# Patient Record
Sex: Female | Born: 1965 | Race: White | Hispanic: No | Smoking: Former smoker
Health system: Southern US, Community
[De-identification: ages and names within clinical notes are randomized; demographics above are authoritative.]

## PROBLEM LIST (undated history)

## (undated) DIAGNOSIS — I10 Essential (primary) hypertension: Secondary | ICD-10-CM

## (undated) DIAGNOSIS — E7800 Pure hypercholesterolemia, unspecified: Secondary | ICD-10-CM

## (undated) DIAGNOSIS — R569 Unspecified convulsions: Secondary | ICD-10-CM

## (undated) HISTORY — PX: TUBAL LIGATION: SHX77

---

## 2005-02-18 ENCOUNTER — Ambulatory Visit: Payer: Self-pay | Admitting: Family Medicine

## 2005-02-26 ENCOUNTER — Ambulatory Visit: Payer: Self-pay | Admitting: Family Medicine

## 2006-07-24 ENCOUNTER — Ambulatory Visit: Payer: Self-pay | Admitting: Family Medicine

## 2008-01-26 ENCOUNTER — Emergency Department: Payer: Self-pay | Admitting: Emergency Medicine

## 2013-08-17 ENCOUNTER — Ambulatory Visit: Payer: Self-pay | Admitting: Family Medicine

## 2016-03-18 ENCOUNTER — Other Ambulatory Visit: Payer: Self-pay | Admitting: Family Medicine

## 2016-03-18 DIAGNOSIS — Z1231 Encounter for screening mammogram for malignant neoplasm of breast: Secondary | ICD-10-CM

## 2016-03-27 ENCOUNTER — Ambulatory Visit
Admission: RE | Admit: 2016-03-27 | Discharge: 2016-03-27 | Disposition: A | Discharge status: Home / Self Care | Payer: BC Managed Care – PPO | Source: Ambulatory Visit | Attending: Family Medicine | Admitting: Family Medicine

## 2016-03-27 DIAGNOSIS — Z1231 Encounter for screening mammogram for malignant neoplasm of breast: Secondary | ICD-10-CM | POA: Insufficient documentation

## 2017-05-14 ENCOUNTER — Other Ambulatory Visit: Payer: Self-pay | Admitting: Family Medicine

## 2017-05-14 DIAGNOSIS — Z1231 Encounter for screening mammogram for malignant neoplasm of breast: Secondary | ICD-10-CM

## 2017-06-10 ENCOUNTER — Ambulatory Visit: Payer: BC Managed Care – PPO

## 2017-07-01 ENCOUNTER — Ambulatory Visit
Admission: RE | Admit: 2017-07-01 | Discharge: 2017-07-01 | Disposition: A | Discharge status: Home / Self Care | Payer: BC Managed Care – PPO | Source: Ambulatory Visit | Attending: Family Medicine | Admitting: Family Medicine

## 2017-07-01 DIAGNOSIS — Z1231 Encounter for screening mammogram for malignant neoplasm of breast: Secondary | ICD-10-CM | POA: Insufficient documentation

## 2018-08-31 ENCOUNTER — Other Ambulatory Visit: Payer: Self-pay | Admitting: Family Medicine

## 2018-08-31 DIAGNOSIS — Z1231 Encounter for screening mammogram for malignant neoplasm of breast: Secondary | ICD-10-CM

## 2018-09-22 ENCOUNTER — Encounter (INDEPENDENT_AMBULATORY_CARE_PROVIDER_SITE_OTHER): Payer: Self-pay

## 2018-09-22 ENCOUNTER — Ambulatory Visit
Admission: RE | Admit: 2018-09-22 | Discharge: 2018-09-22 | Disposition: A | Discharge status: Home / Self Care | Payer: BC Managed Care – PPO | Source: Ambulatory Visit | Attending: Family Medicine | Admitting: Family Medicine

## 2018-09-22 DIAGNOSIS — Z1231 Encounter for screening mammogram for malignant neoplasm of breast: Secondary | ICD-10-CM | POA: Insufficient documentation

## 2019-06-23 ENCOUNTER — Encounter: Payer: Self-pay | Admitting: Emergency Medicine

## 2019-06-23 ENCOUNTER — Ambulatory Visit
Admission: EM | Admit: 2019-06-23 | Discharge: 2019-06-23 | Disposition: A | Discharge status: Home / Self Care | Payer: BC Managed Care – PPO | Attending: Family Medicine | Admitting: Family Medicine

## 2019-06-23 ENCOUNTER — Other Ambulatory Visit: Payer: Self-pay

## 2019-06-23 DIAGNOSIS — R040 Epistaxis: Secondary | ICD-10-CM | POA: Diagnosis not present

## 2019-06-23 DIAGNOSIS — R030 Elevated blood-pressure reading, without diagnosis of hypertension: Secondary | ICD-10-CM | POA: Diagnosis not present

## 2019-06-23 MED ORDER — CLONIDINE HCL 0.1 MG PO TABS
0.1000 mg | ORAL_TABLET | Freq: Once | ORAL | Status: AC
  Administered 2019-06-23: 0.1 mg via ORAL

## 2019-06-23 NOTE — ED Provider Notes (Signed)
[facility]    CSN: [id number] Arrival date & time: 06/23/19  1829      History   Chief Complaint Chief Complaint  Patient presents with  . Epistaxis    HPI Melody Schaefer is a 53 y.o. female.   53 yo female with a c/o nosebleed that started about one hour ago. States this was spontaneous. Denies any falls or other injuries. Denies any pain. Denies any recent illnesses or h/o nosebleeds.      History reviewed. No pertinent past medical history.  There are no active problems to display for this patient.   History reviewed. No pertinent surgical history.  OB History   No obstetric history on file.      Home Medications    Prior to Admission medications   Medication Sig Start Date End Date Taking? Authorizing Provider  buPROPion (WELLBUTRIN) 75 MG tablet Take 75 mg by mouth 2 (two) times daily.   Yes [provider]    Family History Family History  Problem Relation Age of Onset  . Healthy Mother   . Cancer Father   . Breast cancer Neg Hx     Social History Social History   Tobacco Use  . Smoking status: Current Some Day Smoker  . Smokeless tobacco: Never Used  Substance Use Topics  . Alcohol use: Not on file  . Drug use: Not on file     Allergies   Patient has no known allergies.   Review of Systems Review of Systems   Physical Exam Triage Vital Signs ED Triage Vitals  Enc Vitals Group     BP 06/23/19 1848 (!) 183/104     Pulse Rate 06/23/19 1848 (!) 102     Resp 06/23/19 1848 18     Temp --      Temp Source 06/23/19 1848 Oral     SpO2 06/23/19 1848 100 %     Weight 06/23/19 1843 206 lb (93.4 kg)     Height --      Head Circumference --      Peak Flow --      Pain Score 06/23/19 1843 0     Pain Loc --      Pain Edu? --      Excl. in GC? --    No data found.  Updated Vital Signs BP (!) 170/100 (BP Location: Right Arm)   Pulse (!) 102   Resp 18   Wt 93.4 kg   SpO2 100%   Visual Acuity Right Eye  Distance:   Left Eye Distance:   Bilateral Distance:    Right Eye Near:   Left Eye Near:    Bilateral Near:     Physical Exam Vitals signs reviewed.  Constitutional:      General: She is not in acute distress.    Appearance: She is not toxic-appearing or diaphoretic.  HENT:     Nose: No nasal deformity, septal deviation, signs of injury or nasal tenderness.     Right Nostril: Epistaxis present. No septal hematoma.     Left Nostril: Epistaxis present. No septal hematoma.  Neurological:     Mental Status: She is alert.      UC Treatments / Results  Labs (all labs ordered are listed, but only abnormal results are displayed) Labs Reviewed - No data to display  EKG   Radiology No results found.  Procedures Procedures (including critical care time)  Medications Ordered in UC Medications  cloNIDine (CATAPRES) tablet  0.1 mg (0.1 mg Oral Given 06/23/19 1925)    Initial Impression / Assessment and Plan / UC Course  I have reviewed the triage vital signs and the nursing notes.  Pertinent labs & imaging results that were available during my care of the patient were reviewed by me and considered in my medical decision making (see chart for details).      Final Clinical Impressions(s) / UC Diagnoses   Final diagnoses:  Epistaxis  Elevated blood-pressure reading without diagnosis of hypertension     Discharge Instructions     Follow up with Primary Care provider this week Go to Emergency Department if nose bleeds recur and worsen    ED Prescriptions    None      1. diagnosis reviewed with patient 2. Nostrils packed with neo-synephrine soaked gauze with resolution of nose bleed while in the urgent care 3. Patient given clonidine 0.1mg  po x 1 with improvement of blood pressure 4. Follow up with PCP this week for recheck blood pressure 5. Follow-up prn    PDMP not reviewed this encounter.   Norval Gable, MD 06/27/19 [phone]

## 2019-06-23 NOTE — Discharge Instructions (Addendum)
Follow up with Primary Care provider this week Go to Emergency Department if nose bleeds recur and worsen

## 2019-06-23 NOTE — ED Triage Notes (Signed)
Patient in office c/o nosebleed started at 5:45p.m some blood clots in  Wash cloth  Has subsided since in office

## 2019-07-05 ENCOUNTER — Other Ambulatory Visit: Payer: Self-pay

## 2019-07-05 DIAGNOSIS — Z20822 Contact with and (suspected) exposure to covid-19: Secondary | ICD-10-CM

## 2019-07-06 LAB — NOVEL CORONAVIRUS, NAA: SARS-CoV-2, NAA: NOT DETECTED

## 2021-08-29 ENCOUNTER — Other Ambulatory Visit: Payer: Self-pay | Admitting: Family Medicine

## 2021-08-29 DIAGNOSIS — Z1231 Encounter for screening mammogram for malignant neoplasm of breast: Secondary | ICD-10-CM

## 2021-10-03 ENCOUNTER — Other Ambulatory Visit: Payer: Self-pay

## 2021-10-03 ENCOUNTER — Ambulatory Visit
Admission: RE | Admit: 2021-10-03 | Discharge: 2021-10-03 | Disposition: A | Discharge status: Home / Self Care | Payer: BC Managed Care – PPO | Source: Ambulatory Visit | Attending: Family Medicine | Admitting: Family Medicine

## 2021-10-03 DIAGNOSIS — Z1231 Encounter for screening mammogram for malignant neoplasm of breast: Secondary | ICD-10-CM | POA: Diagnosis present

## 2022-11-21 IMAGING — MG MM DIGITAL SCREENING BILAT W/ TOMO AND CAD
8 series · 8 of 24 positions shown · non-contrast
Comparison: Previous exam(s).

CLINICAL DATA: Screening.

EXAM:
DIGITAL SCREENING BILATERAL MAMMOGRAM WITH TOMOSYNTHESIS AND CAD
TECHNIQUE: Bilateral screening digital craniocaudal and mediolateral oblique
mammograms were obtained. Bilateral screening digital breast
tomosynthesis was performed. The images were evaluated with
computer-aided detection.

[L MLO synth-2D]
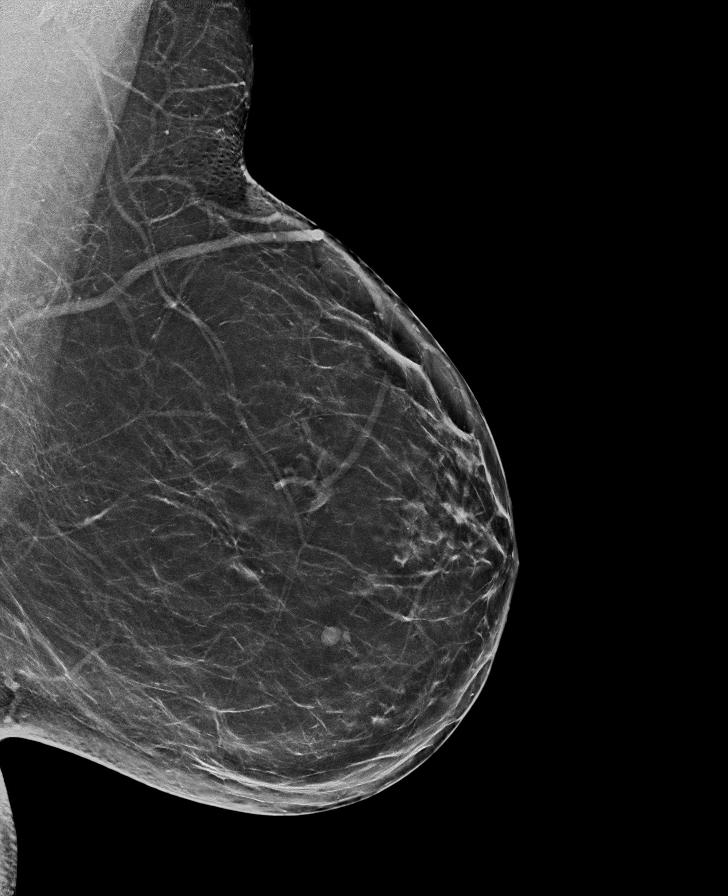

[L CC synth-2D]
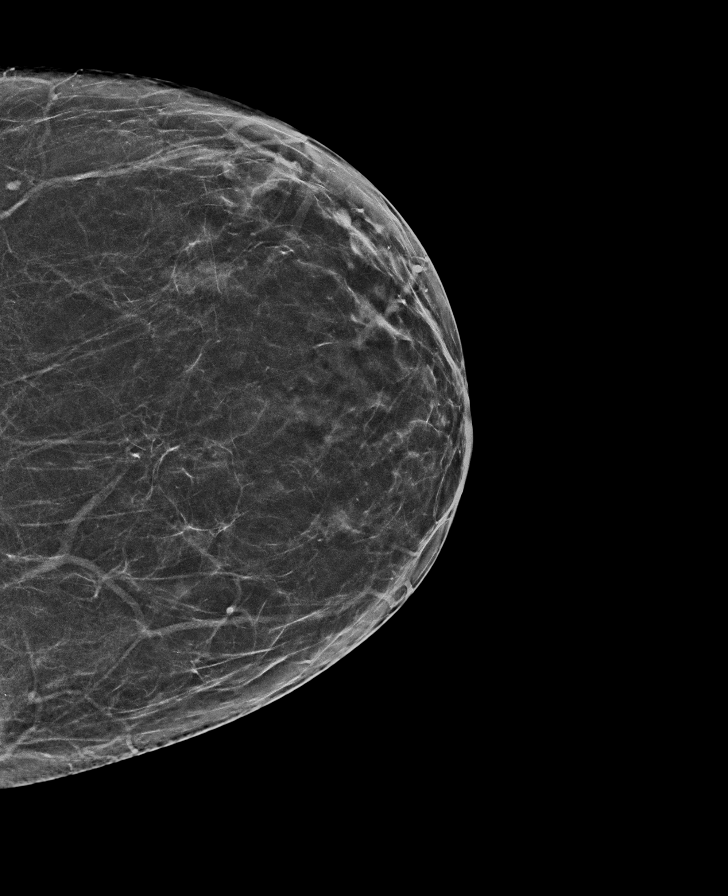

[R MLO synth-2D]
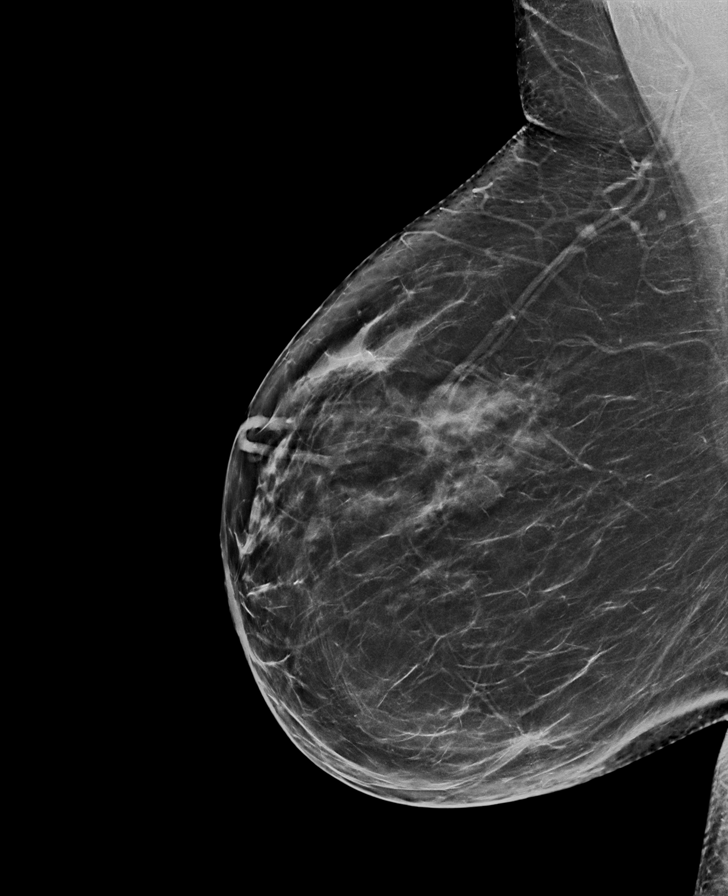

[R CC synth-2D]
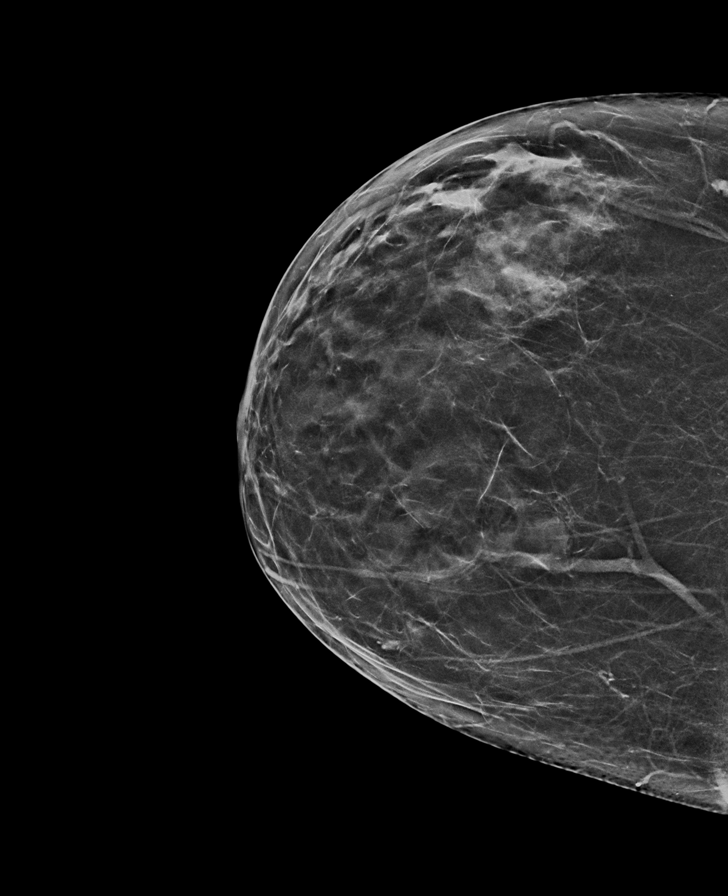

[L MLO tomo · tomo slice 39/77.0]
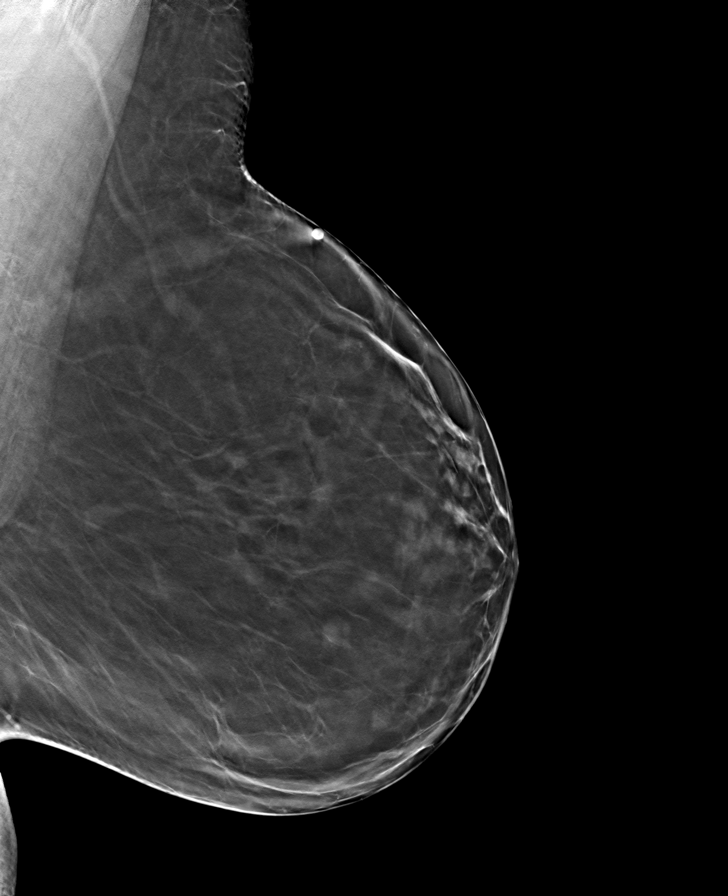

[L CC tomo · tomo slice 35/69.0]
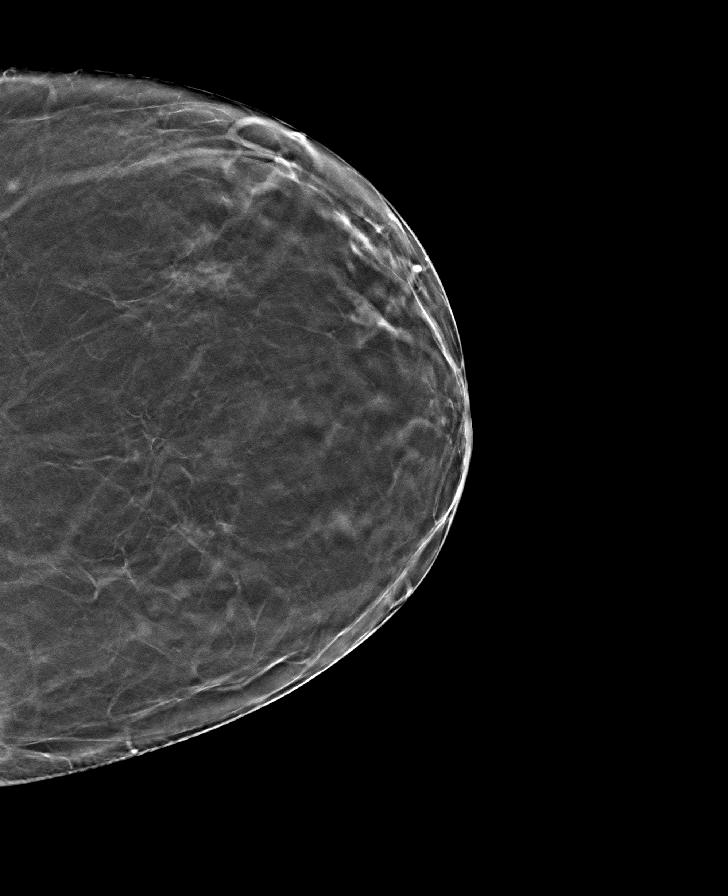

[R MLO tomo · tomo slice 39/76.0]
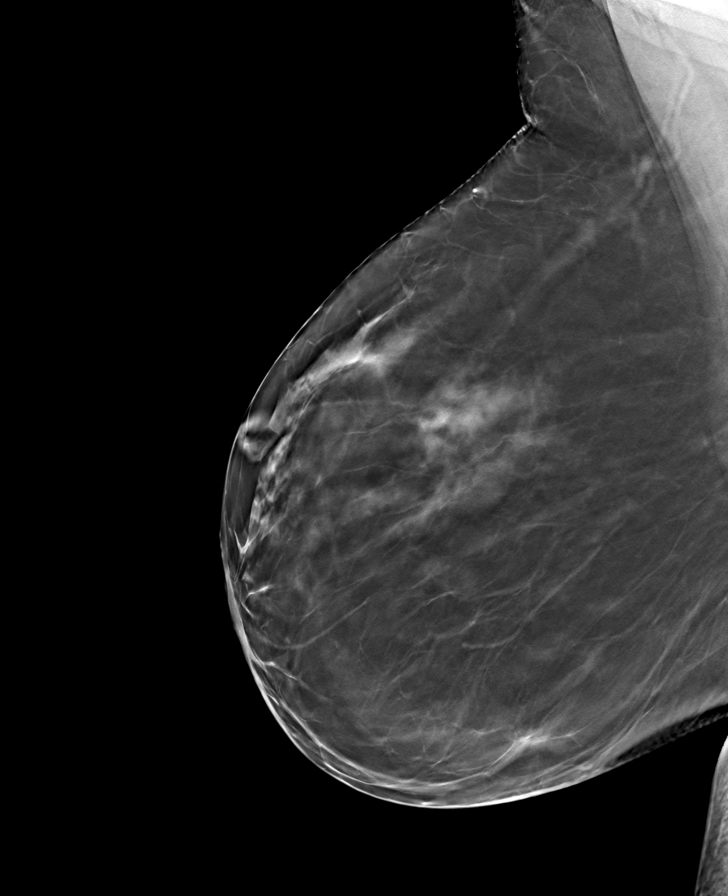

[R CC tomo · tomo slice 33/66.0]
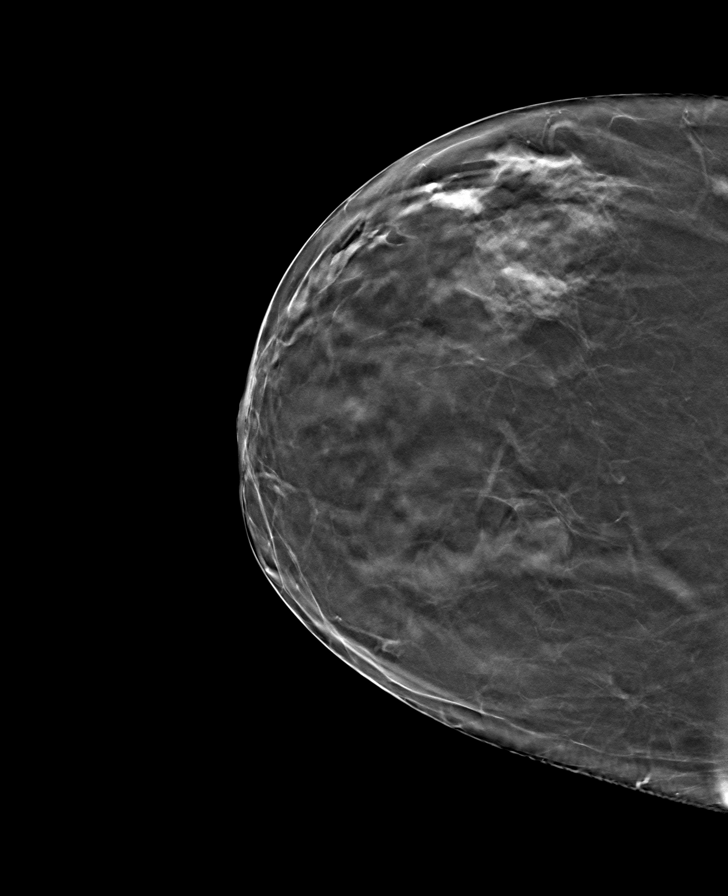

[8 of 24 positions shown; findings below may reference images not displayed]

ACR Breast Density Category b: There are scattered areas of
fibroglandular density.
FINDINGS: There are no findings suspicious for malignancy.
IMPRESSION: No mammographic evidence of malignancy. A result letter of this
screening mammogram will be mailed directly to the patient.

RECOMMENDATION:
Screening mammogram in one year. (Code:51-O-LD2)

BI-RADS CATEGORY  1: Negative.

## 2024-01-18 ENCOUNTER — Encounter: Payer: Self-pay | Admitting: Emergency Medicine

## 2024-01-18 ENCOUNTER — Ambulatory Visit
Admission: EM | Admit: 2024-01-18 | Discharge: 2024-01-18 | Disposition: A | Discharge status: Home / Self Care | Attending: Physician Assistant | Admitting: Physician Assistant

## 2024-01-18 DIAGNOSIS — H6501 Acute serous otitis media, right ear: Secondary | ICD-10-CM | POA: Insufficient documentation

## 2024-01-18 DIAGNOSIS — J019 Acute sinusitis, unspecified: Secondary | ICD-10-CM | POA: Insufficient documentation

## 2024-01-18 DIAGNOSIS — H9201 Otalgia, right ear: Secondary | ICD-10-CM | POA: Diagnosis present

## 2024-01-18 DIAGNOSIS — R42 Dizziness and giddiness: Secondary | ICD-10-CM | POA: Diagnosis present

## 2024-01-18 DIAGNOSIS — I10 Essential (primary) hypertension: Secondary | ICD-10-CM | POA: Insufficient documentation

## 2024-01-18 HISTORY — DX: Essential (primary) hypertension: I10

## 2024-01-18 LAB — GROUP A STREP BY PCR: Group A Strep by PCR: NOT DETECTED

## 2024-01-18 MED ORDER — CEFDINIR 300 MG PO CAPS: 300.0000 mg | ORAL_CAPSULE | Freq: Two times a day (BID) | ORAL | 0 refills | Status: AC

## 2024-01-18 MED ORDER — IPRATROPIUM BROMIDE 0.06 % NA SOLN: 2.0000 | Freq: Four times a day (QID) | NASAL | 0 refills | Status: DC

## 2024-01-18 MED ORDER — MECLIZINE HCL 25 MG PO TABS: 25.0000 mg | ORAL_TABLET | Freq: Three times a day (TID) | ORAL | 0 refills | Status: DC | PRN

## 2024-01-18 NOTE — ED Provider Notes (Signed)
 [facility]    CSN: [id number] Arrival date & time: 01/18/24  1157      History   Chief Complaint Chief Complaint  Patient presents with   Otalgia    right   Sinus Problem    HPI Melody Schaefer is a 58 y.o. adult presenting for 1 week history of sore throat, congestion, right-sided ear pain/pressure.  Symptoms got worse over the past couple days. Patient says they have been struggling with allergies for a few weeks.  Reports increased dizziness over the past couple of days.  States they feel best when lying flat.  Denies fever.  Mild cough.  Denies chest pain or wheezing.  Has been taking allergy medicine over-the-counter without relief.  HPI  Past Medical History:  Diagnosis Date   Hypertension     There are no active problems to display for this patient.   Past Surgical History:  Procedure Laterality Date   TUBAL LIGATION      OB History   No obstetric history on file.      Home Medications    Prior to Admission medications   Medication Sig Start Date End Date Taking? Authorizing Provider  cefdinir (OMNICEF) 300 MG capsule Take 1 capsule (300 mg total) by mouth 2 (two) times daily for 7 days. 01/18/24 01/25/24 Yes Nancy Axon B, PA-C  ipratropium (ATROVENT) 0.06 % nasal spray Place 2 sprays into both nostrils 4 (four) times daily. 01/18/24  Yes Floydene Hy, PA-C  meclizine (ANTIVERT) 25 MG tablet Take 1 tablet (25 mg total) by mouth 3 (three) times daily as needed for dizziness. 01/18/24  Yes Nancy Axon B, PA-C  atorvastatin (LIPITOR) 20 MG tablet Take 20 mg by mouth daily.    [provider]  buPROPion (WELLBUTRIN) 75 MG tablet Take 75 mg by mouth 2 (two) times daily.    [provider]  triamterene-hydrochlorothiazide (DYAZIDE) 37.5-25 MG capsule Take 1 capsule by mouth daily.    [provider]    Family History Family History  Problem Relation Age of Onset   Healthy Mother    Cancer Father    Breast cancer  Neg Hx     Social History Social History   Tobacco Use   Smoking status: Some Days   Smokeless tobacco: Never  Vaping Use   Vaping status: Never Used  Substance Use Topics   Alcohol use: Yes   Drug use: Never     Allergies   Patient has no known allergies.   Review of Systems Review of Systems  Constitutional:  Positive for fatigue. Negative for chills, diaphoresis and fever.  HENT:  Positive for congestion, ear pain, postnasal drip, rhinorrhea, sinus pressure, sinus pain and sore throat. Negative for ear discharge.   Respiratory:  Positive for cough. Negative for shortness of breath.   Gastrointestinal:  Negative for abdominal pain, nausea and vomiting.  Musculoskeletal:  Negative for arthralgias and myalgias.  Skin:  Negative for rash.  Allergic/Immunologic: Positive for environmental allergies.  Neurological:  Positive for dizziness and headaches. Negative for weakness.  Hematological:  Negative for adenopathy.     Physical Exam Triage Vital Signs ED Triage Vitals [01/18/24 1221]  Encounter Vitals Group     BP      Systolic BP Percentile      Diastolic BP Percentile      Pulse      Resp      Temp      Temp src  SpO2      Weight 205 lb 14.6 oz (93.4 kg)     Height 5\' 11"  (1.803 m)     Head Circumference      Peak Flow      Pain Score 8     Pain Loc      Pain Education      Exclude from Growth Chart    No data found.  Updated Vital Signs BP (!) 159/82 (BP Location: Left Arm)   Pulse 92   Temp 98.5 F (36.9 C) (Oral)   Resp 14   Ht 5\' 11"  (1.803 m)   Wt 205 lb 14.6 oz (93.4 kg)   SpO2 97%   BMI 28.72 kg/m      Physical Exam Vitals and nursing note reviewed.  Constitutional:      General: Melody Schaefer is not in acute distress.    Appearance: Normal appearance. Melody Schaefer is not ill-appearing or toxic-appearing.  HENT:     Head: Normocephalic and atraumatic.     Right Ear: Ear canal and external ear normal. A middle ear effusion is present.  Tympanic membrane is injected.     Left Ear: Tympanic membrane, ear canal and external ear normal.     Nose: Congestion present.     Right Sinus: Maxillary sinus tenderness present.     Left Sinus: Maxillary sinus tenderness present.     Mouth/Throat:     Mouth: Mucous membranes are moist.     Pharynx: Oropharynx is clear. Posterior oropharyngeal erythema present.  Eyes:     General: No scleral icterus.       Right eye: No discharge.        Left eye: No discharge.     Conjunctiva/sclera: Conjunctivae normal.  Cardiovascular:     Rate and Rhythm: Normal rate and regular rhythm.     Heart sounds: Normal heart sounds.  Pulmonary:     Effort: Pulmonary effort is normal. No respiratory distress.     Breath sounds: Normal breath sounds.  Musculoskeletal:     Cervical back: Neck supple.  Skin:    General: Skin is dry.  Neurological:     General: No focal deficit present.     Mental Status: Melody Schaefer is alert. Mental status is at baseline.     Motor: No weakness.     Gait: Gait normal.  Psychiatric:        Mood and Affect: Mood normal.        Behavior: Behavior normal.      UC Treatments / Results  Labs (all labs ordered are listed, but only abnormal results are displayed) Labs Reviewed  GROUP A STREP BY PCR    EKG   Radiology No results found.  Procedures Procedures (including critical care time)  Medications Ordered in UC Medications - No data to display  Initial Impression / Assessment and Plan / UC Course  I have reviewed the triage vital signs and the nursing notes.  Pertinent labs & imaging results that were available during my care of the patient were reviewed by me and considered in my medical decision making (see chart for details).   58 y/o presents for >1 week of right ear pain/fullness, dizziness, sinus pain, congestion.  History of allergies.  Symptoms got worse in the past couple of days.  Blood pressure 159/82.  Patient takes Dyazide.  Advised to keep  taking Dyazide.  Consistently.  140/90 BP of follow-up with primary care provider.  Postnasal congestion, effusion of right TM  with bulging.  Maxillary sinus tenderness.  Mild posterior pharyngeal erythema.  Chest clear.  Strep negative.  Acute sinusitis, acute serous otitis media.  Treating at this time with cefdinir.  Patient believes they were treated for UTI with an antibiotic that started with an "A" in the past couple weeks.  This could be Augmentin so that is why I prescribed cefdinir.  Also sent Atrovent nasal spray and meclizine.  Encouraged use of Zyrtec-D, nasal saline, sinus rinses.  Reviewed return precautions.   Final Clinical Impressions(s) / UC Diagnoses   Final diagnoses:  Acute sinusitis, recurrence not specified, unspecified location  Otalgia of right ear  Right acute serous otitis media, recurrence not specified  Dizziness  Essential hypertension     Discharge Instructions      - I sent an antibiotic to perform to treat sinus infection.  Continue with your allergy medicine but it may be best for you to switch to 1 that contains a decongestant such as Zyrtec-D or Allegra-D. - I sent meclizine as needed for dizziness and a nasal spray to use daily. - It may take a couple weeks for the fluid to completely dry out but hopefully getting some relief in the next few days. - If you develop fever or worsening symptoms.  Return for reevaluation. -Blood pressure is elevated at 159/82.  Continue taking your Dyazide.  If consistently over 140/90 please follow-up with your primary care provider.   ED Prescriptions     Medication Sig Dispense Auth. Provider   cefdinir (OMNICEF) 300 MG capsule Take 1 capsule (300 mg total) by mouth 2 (two) times daily for 7 days. 14 capsule Nancy Axon B, PA-C   ipratropium (ATROVENT) 0.06 % nasal spray Place 2 sprays into both nostrils 4 (four) times daily. 15 mL Nancy Axon B, PA-C   meclizine (ANTIVERT) 25 MG tablet Take 1 tablet (25 mg  total) by mouth 3 (three) times daily as needed for dizziness. 30 tablet Sloane Palmer B, PA-C      PDMP not reviewed this encounter.   Floydene Hy, PA-C 01/18/24 1315

## 2024-01-18 NOTE — ED Triage Notes (Signed)
 Patient c/o sore throat for a week.  Patient reports right ear pain that started 2 days ago.  Patient also reports some sinus congestion and pressure. Patient unsure of fevers.

## 2024-01-18 NOTE — Discharge Instructions (Signed)
-   I sent an antibiotic to perform to treat sinus infection.  Continue with your allergy medicine but it may be best for you to switch to 1 that contains a decongestant such as Zyrtec-D or Allegra-D. - I sent meclizine as needed for dizziness and a nasal spray to use daily. - It may take a couple weeks for the fluid to completely dry out but hopefully getting some relief in the next few days. - If you develop fever or worsening symptoms.  Return for reevaluation. -Blood pressure is elevated at 159/82.  Continue taking your Dyazide.  If consistently over 140/90 please follow-up with your primary care provider.

## 2024-02-23 ENCOUNTER — Emergency Department: Payer: Worker's Compensation

## 2024-02-23 ENCOUNTER — Emergency Department
Admission: EM | Admit: 2024-02-23 | Discharge: 2024-02-23 | Disposition: A | Discharge status: Home / Self Care | Payer: Worker's Compensation | Attending: Emergency Medicine | Admitting: Emergency Medicine

## 2024-02-23 ENCOUNTER — Other Ambulatory Visit: Payer: Self-pay

## 2024-02-23 DIAGNOSIS — I10 Essential (primary) hypertension: Secondary | ICD-10-CM | POA: Diagnosis not present

## 2024-02-23 DIAGNOSIS — S01112A Laceration without foreign body of left eyelid and periocular area, initial encounter: Secondary | ICD-10-CM | POA: Diagnosis present

## 2024-02-23 DIAGNOSIS — R55 Syncope and collapse: Secondary | ICD-10-CM

## 2024-02-23 DIAGNOSIS — W1830XA Fall on same level, unspecified, initial encounter: Secondary | ICD-10-CM | POA: Insufficient documentation

## 2024-02-23 DIAGNOSIS — S0181XA Laceration without foreign body of other part of head, initial encounter: Secondary | ICD-10-CM

## 2024-02-23 DIAGNOSIS — R569 Unspecified convulsions: Secondary | ICD-10-CM | POA: Diagnosis not present

## 2024-02-23 LAB — CBC WITH DIFFERENTIAL/PLATELET
Abs Immature Granulocytes: 0.07 10*3/uL (ref 0.00–0.07)
Basophils Absolute: 0.1 10*3/uL (ref 0.0–0.1)
Basophils Relative: 1 %
Eosinophils Absolute: 0.1 10*3/uL (ref 0.0–0.5)
Eosinophils Relative: 1 %
HCT: 45.1 % (ref 36.0–46.0)
Hemoglobin: 15.7 g/dL — ABNORMAL HIGH (ref 12.0–15.0)
Immature Granulocytes: 1 %
Lymphocytes Relative: 24 %
Lymphs Abs: 2.5 10*3/uL (ref 0.7–4.0)
MCH: 32.1 pg (ref 26.0–34.0)
MCHC: 34.8 g/dL (ref 30.0–36.0)
MCV: 92.2 fL (ref 80.0–100.0)
Monocytes Absolute: 0.8 10*3/uL (ref 0.1–1.0)
Monocytes Relative: 7 %
Neutro Abs: 7 10*3/uL (ref 1.7–7.7)
Neutrophils Relative %: 66 %
Platelets: 298 10*3/uL (ref 150–400)
RBC: 4.89 MIL/uL (ref 3.87–5.11)
RDW: 11.9 % (ref 11.5–15.5)
WBC: 10.4 10*3/uL (ref 4.0–10.5)
nRBC: 0 % (ref 0.0–0.2)

## 2024-02-23 LAB — BASIC METABOLIC PANEL WITH GFR
Anion gap: 15 (ref 5–15)
BUN: 19 mg/dL (ref 6–20)
CO2: 20 mmol/L — ABNORMAL LOW (ref 22–32)
Calcium: 9.3 mg/dL (ref 8.9–10.3)
Chloride: 103 mmol/L (ref 98–111)
Creatinine, Ser: 0.91 mg/dL (ref 0.44–1.00)
GFR, Estimated: >60 mL/min (ref >60)
Glucose, Bld: 215 mg/dL — ABNORMAL HIGH (ref 70–99)
Potassium: 3.4 mmol/L — ABNORMAL LOW (ref 3.5–5.1)
Sodium: 138 mmol/L (ref 135–145)

## 2024-02-23 MED ORDER — PSEUDOEPHEDRINE HCL 60 MG PO TABS: 60.0000 mg | ORAL_TABLET | Freq: Four times a day (QID) | ORAL | 0 refills | Status: DC | PRN

## 2024-02-23 MED ORDER — SODIUM CHLORIDE 0.9 % IV BOLUS
1000.0000 mL | Freq: Once | INTRAVENOUS | Status: AC
  Administered 2024-02-23: 1000 mL via INTRAVENOUS

## 2024-02-23 MED ORDER — ACETAMINOPHEN 325 MG PO TABS
650.0000 mg | ORAL_TABLET | Freq: Once | ORAL | Status: AC
  Administered 2024-02-23: 650 mg via ORAL
  Filled 2024-02-23: qty 2

## 2024-02-23 MED ORDER — LIDOCAINE HCL (PF) 1 % IJ SOLN
10.0000 mL | Freq: Once | INTRAMUSCULAR | Status: AC
  Administered 2024-02-23: 10 mL
  Filled 2024-02-23: qty 10

## 2024-02-23 MED ORDER — ONDANSETRON HCL 4 MG/2ML IJ SOLN
4.0000 mg | Freq: Once | INTRAMUSCULAR | Status: AC
  Administered 2024-02-23: 4 mg via INTRAVENOUS
  Filled 2024-02-23: qty 2

## 2024-02-23 NOTE — ED Triage Notes (Signed)
 From work, Employees states pt had a witnessed seizure, fell to the ground in convulsions x 2-3 mins. No prior hx of seizures. States she has no pain and does not remember any events just prior to seizure.

## 2024-02-23 NOTE — ED Triage Notes (Signed)
 Patient states she remembers air conditioning in the work [employer] was not working and she was hot prior to Seizure

## 2024-02-23 NOTE — Discharge Instructions (Addendum)
 Return in 5 to 7 days to have the sutures removed.  Follow-up with your primary care doctor.  Return to the ER immediately for new, worsening, recurrent episodes of feeling lightheaded, like you are going to pass out, recurrent episodes of passing out, any recurrent seizure-like episodes, or any other new or worsening symptoms that concern you.

## 2024-02-23 NOTE — ED Provider Notes (Signed)
 [facility] Provider Note    Event Date/Time   First MD Initiated Contact with Patient 02/23/24 1154     (approximate)   History   Seizures   HPI  Melody Schaefer is a 58 y.o. adult with a history of hypertension who presents with loss of consciousness and apparent seizure witnessed by her coworkers.  Per EMS, the patient fell to the ground and had convulsions for 2 to 3 minutes.  She hit her head and sustained a laceration to the left side of her forehead.  She did not have any incontinence and did not bite her tongue.  The patient states that she felt hot and thinks she might of passed out due to the heat.  She has never had a seizure before.  She reports localized pain to where she hit her head but denies any other acute symptoms at this time.  She states that she was in her usual state of health in the last few days and has not any changes to medications.  I reviewed the past medical records.  The patient's most recent outpatient counter was on 5/11 at [facility] urgent care for URI symptoms and otalgia.  She has no recent ED visits or admissions.  There is no prior seizure history.   Physical Exam   Triage Vital Signs: ED Triage Vitals  Encounter Vitals Group     BP 02/23/24 1153 (!) 145/80     Girls Systolic BP Percentile --      Girls Diastolic BP Percentile --      Boys Systolic BP Percentile --      Boys Diastolic BP Percentile --      Pulse Rate 02/23/24 1153 (!) 121     Resp 02/23/24 1153 18     Temp 02/23/24 1153 98 F (36.7 C)     Temp Source 02/23/24 1153 Oral     SpO2 02/23/24 1153 91 %     Weight --      Height --      Head Circumference --      Peak Flow --      Pain Score 02/23/24 1150 0     Pain Loc --      Pain Education --      Exclude from Growth Chart --     Most recent vital signs: Vitals:   02/23/24 1605 02/23/24 1606  BP:    Pulse:  87  Resp:  19  Temp: 97.9 F (36.6 C)   SpO2:  96%     General: Alert and  oriented, no distress.  CV:  Good peripheral perfusion.  Resp:  Normal effort.  Abd:  No distention.  Other:  EOMI.  PERRLA.  No photophobia.  No facial droop.  Normal speech.  Motor intact in all extremities.  No ataxia.  3 cm superficial laceration to left lateral forehead/temporal area.   ED Results / Procedures / Treatments   Labs (all labs ordered are listed, but only abnormal results are displayed) Labs Reviewed  BASIC METABOLIC PANEL WITH GFR - Abnormal; Notable for the following components:      Result Value   Potassium 3.4 (*)    CO2 20 (*)    Glucose, Bld 215 (*)    All other components within normal limits  CBC WITH DIFFERENTIAL/PLATELET - Abnormal; Notable for the following components:   Hemoglobin 15.7 (*)    All other components within normal limits     EKG  ED ECG REPORT I, Melody Repine, the attending physician, personally viewed and interpreted this ECG.  Date: 02/23/2024 EKG Time: 1311 Rate: 98 Rhythm: normal sinus rhythm QRS Axis: normal Intervals: normal ST/T Wave abnormalities: Nonspecific T wave abnormalities inferiorly Narrative Interpretation: no evidence of acute ischemia; no prior EKG available for comparison    RADIOLOGY  CT head: I independently viewed and interpreted the images; there is no ICH.  Radiology report indicates no acute abnormality.  PROCEDURES:  Critical Care performed: No  .Laceration Repair  Date/Time: 02/23/2024 4:28 PM  Performed by: Melody Repine, MD Authorized by: Melody Repine, MD   Consent:    Consent obtained:  Verbal   Consent given by:  Patient Anesthesia:    Anesthesia method:  Local infiltration   Local anesthetic:  Lidocaine 1% w/o epi Laceration details:    Location:  Face   Face location:  L eyebrow   Length (cm):  4   Depth (mm):  3 Exploration:    Hemostasis achieved with:  Direct pressure   Contaminated: no   Treatment:    Area cleansed with:  Povidone-iodine   Amount of  cleaning:  Extensive   Irrigation solution:  Sterile saline   Irrigation method:  Syringe   Debridement:  Minimal   Undermining:  None Skin repair:    Repair method:  Sutures   Suture size:  5-0   Suture material:  Nylon   Suture technique:  Simple interrupted   Number of sutures:  8 Approximation:    Approximation:  Close Repair type:    Repair type:  Simple Post-procedure details:    Dressing:  Sterile dressing   Procedure completion:  Tolerated well, no immediate complications    MEDICATIONS ORDERED IN ED: Medications  ondansetron (ZOFRAN) injection 4 mg (4 mg Intravenous Given 02/23/24 1222)  sodium chloride 0.9 % bolus 1,000 mL (0 mLs Intravenous Stopped 02/23/24 1606)  lidocaine (PF) (XYLOCAINE) 1 % injection 10 mL (10 mLs Other Given by Other 02/23/24 1455)  acetaminophen (TYLENOL) tablet 650 mg (650 mg Oral Given 02/23/24 1616)     IMPRESSION / MDM / ASSESSMENT AND PLAN / ED COURSE  I reviewed the triage vital signs and the nursing notes.  58 year old female with PMH as noted above presents with an episode of loss of consciousness and possible seizure-like activity.  She has no prior seizure history.  On exam the patient is tachycardic.  Other vital signs are normal.  Neurologic exam is nonfocal.  There is a laceration to the left forehead.  Differential diagnosis includes, but is not limited to, epileptic seizure, nonepileptic seizure, syncope with convulsions.  We will obtain CT head, labs, repair the laceration, give fluids, and reassess.  Patient's presentation is most consistent with acute presentation with potential threat to life or bodily function.  The patient is on the cardiac monitor to evaluate for evidence of arrhythmia and/or significant heart rate changes.  ----------------------------------------- 4:28 PM on 02/23/2024 -----------------------------------------  CT head is negative for acute findings.  BMP shows no acute abnormality.  CBC shows no  leukocytosis or anemia.  EKG is nonischemic.  The patient has remained alert with a normal neurologic exam.  The laceration was repaired.  The patient reports that she has had a tetanus vaccination within the last 5 years.  I had an extensive discussion with the patient about the results of the workup and possible etiologies of this presentation.  The patient did not have any urinary notes and did not bite her  tongue.  She also did not have any notable postictal period.  She states that she was in a vehicle which was very warm, she felt quite hot, lightheaded, and then at 1 point was conscious but felt like she could not speak normally.  This is right before she lost consciousness.  Overall, the presentation favors syncope, likely vasovagal, with resultant seizure-like convulsions, rather than a true epileptic seizure.  Given the negative CT and unremarkable labs, the patient is stable for discharge home.  There is no evidence of a cardiac etiology.  I gave strict return precautions and she expressed understanding.  She will follow-up with her primary care provider.  She also reports recurrent right ear congestion that has caused some intermittent episodes of lightheadedness.  This could be contributing as well.  She already completed a course of antibiotics.  I examined the right ear and there is no significant bulging to the TM.  I have prescribed Sudafed.  The patient is also taking an antihistamine.     FINAL CLINICAL IMPRESSION(S) / ED DIAGNOSES   Final diagnoses:  Syncope, unspecified syncope type  Seizure-like activity (HCC)  Laceration of forehead, initial encounter     Rx / DC Orders   ED Discharge Orders          Ordered    pseudoephedrine (SUDAFED) 60 MG tablet  Every 6 hours PRN        02/23/24 1611             Note:  This document was prepared using Dragon voice recognition software and may include unintentional dictation errors.    Melody Repine, MD 02/23/24  [phone]

## 2024-02-23 NOTE — ED Notes (Signed)
 This RN redressed pt's head wound, laceration present approx 1.5 inches, no active bleeding at this time, pt endorsed mild pain on palpitation.

## 2024-03-18 ENCOUNTER — Inpatient Hospital Stay
Admission: EM | Admit: 2024-03-18 | Discharge: 2024-03-20 | DRG: 100 | Disposition: A | Discharge status: Home / Self Care | Payer: Worker's Compensation | Attending: Internal Medicine | Admitting: Internal Medicine

## 2024-03-18 ENCOUNTER — Other Ambulatory Visit: Payer: Self-pay

## 2024-03-18 DIAGNOSIS — R569 Unspecified convulsions: Principal | ICD-10-CM | POA: Diagnosis present

## 2024-03-18 DIAGNOSIS — R55 Syncope and collapse: Secondary | ICD-10-CM | POA: Diagnosis not present

## 2024-03-18 DIAGNOSIS — Z7982 Long term (current) use of aspirin: Secondary | ICD-10-CM

## 2024-03-18 DIAGNOSIS — Z7902 Long term (current) use of antithrombotics/antiplatelets: Secondary | ICD-10-CM | POA: Diagnosis not present

## 2024-03-18 DIAGNOSIS — R4020 Unspecified coma: Principal | ICD-10-CM

## 2024-03-18 DIAGNOSIS — Z79899 Other long term (current) drug therapy: Secondary | ICD-10-CM

## 2024-03-18 DIAGNOSIS — I7774 Dissection of vertebral artery: Secondary | ICD-10-CM | POA: Diagnosis present

## 2024-03-18 DIAGNOSIS — I10 Essential (primary) hypertension: Secondary | ICD-10-CM | POA: Diagnosis present

## 2024-03-18 DIAGNOSIS — Z9851 Tubal ligation status: Secondary | ICD-10-CM

## 2024-03-18 DIAGNOSIS — E860 Dehydration: Secondary | ICD-10-CM | POA: Diagnosis present

## 2024-03-18 DIAGNOSIS — T502X5A Adverse effect of carbonic-anhydrase inhibitors, benzothiadiazides and other diuretics, initial encounter: Secondary | ICD-10-CM | POA: Diagnosis present

## 2024-03-18 DIAGNOSIS — E559 Vitamin D deficiency, unspecified: Secondary | ICD-10-CM | POA: Diagnosis present

## 2024-03-18 DIAGNOSIS — N390 Urinary tract infection, site not specified: Secondary | ICD-10-CM | POA: Diagnosis not present

## 2024-03-18 DIAGNOSIS — E538 Deficiency of other specified B group vitamins: Secondary | ICD-10-CM | POA: Diagnosis not present

## 2024-03-18 DIAGNOSIS — Z87891 Personal history of nicotine dependence: Secondary | ICD-10-CM

## 2024-03-18 DIAGNOSIS — E876 Hypokalemia: Secondary | ICD-10-CM | POA: Diagnosis present

## 2024-03-18 DIAGNOSIS — G450 Vertebro-basilar artery syndrome: Secondary | ICD-10-CM | POA: Diagnosis not present

## 2024-03-18 DIAGNOSIS — E785 Hyperlipidemia, unspecified: Secondary | ICD-10-CM | POA: Diagnosis present

## 2024-03-18 DIAGNOSIS — E878 Other disorders of electrolyte and fluid balance, not elsewhere classified: Secondary | ICD-10-CM | POA: Diagnosis present

## 2024-03-18 DIAGNOSIS — R739 Hyperglycemia, unspecified: Secondary | ICD-10-CM | POA: Diagnosis present

## 2024-03-18 LAB — BASIC METABOLIC PANEL WITH GFR
Anion gap: 14 (ref 5–15)
BUN: 13 mg/dL (ref 6–20)
CO2: 21 mmol/L — ABNORMAL LOW (ref 22–32)
Calcium: 8.3 mg/dL — ABNORMAL LOW (ref 8.9–10.3)
Chloride: 104 mmol/L (ref 98–111)
Creatinine, Ser: 0.69 mg/dL (ref 0.44–1.00)
GFR, Estimated: >60 mL/min (ref >60)
Glucose, Bld: 152 mg/dL — ABNORMAL HIGH (ref 70–99)
Potassium: 2.7 mmol/L — CL (ref 3.5–5.1)
Sodium: 139 mmol/L (ref 135–145)

## 2024-03-18 LAB — MAGNESIUM: Magnesium: 1.9 mg/dL (ref 1.7–2.4)

## 2024-03-18 LAB — CBC WITH DIFFERENTIAL/PLATELET
Abs Immature Granulocytes: 0.08 K/uL — ABNORMAL HIGH (ref 0.00–0.07)
Basophils Absolute: 0 K/uL (ref 0.0–0.1)
Basophils Relative: 0 %
Eosinophils Absolute: 0.1 K/uL (ref 0.0–0.5)
Eosinophils Relative: 1 %
HCT: 42.2 % (ref 36.0–46.0)
Hemoglobin: 14.6 g/dL (ref 12.0–15.0)
Immature Granulocytes: 1 %
Lymphocytes Relative: 19 %
Lymphs Abs: 1.8 K/uL (ref 0.7–4.0)
MCH: 31.9 pg (ref 26.0–34.0)
MCHC: 34.6 g/dL (ref 30.0–36.0)
MCV: 92.1 fL (ref 80.0–100.0)
Monocytes Absolute: 0.6 K/uL (ref 0.1–1.0)
Monocytes Relative: 6 %
Neutro Abs: 7 K/uL (ref 1.7–7.7)
Neutrophils Relative %: 73 %
Platelets: 293 K/uL (ref 150–400)
RBC: 4.58 MIL/uL (ref 3.87–5.11)
RDW: 11.9 % (ref 11.5–15.5)
WBC: 9.6 K/uL (ref 4.0–10.5)
nRBC: 0 % (ref 0.0–0.2)

## 2024-03-18 LAB — VITAMIN B12: Vitamin B-12: 187 pg/mL (ref 180–914)

## 2024-03-18 LAB — TROPONIN I (HIGH SENSITIVITY)
Troponin I (High Sensitivity): 7 ng/L (ref <18)
Troponin I (High Sensitivity): 14 ng/L (ref <18)

## 2024-03-18 LAB — PHOSPHORUS: Phosphorus: 2.2 mg/dL — ABNORMAL LOW (ref 2.5–4.6)

## 2024-03-18 LAB — VITAMIN D 25 HYDROXY (VIT D DEFICIENCY, FRACTURES): Vit D, 25-Hydroxy: 20.22 ng/mL — ABNORMAL LOW (ref 30–100)

## 2024-03-18 LAB — TSH: TSH: 2.088 u[IU]/mL (ref 0.350–4.500)

## 2024-03-18 LAB — CK: Total CK: 76 U/L (ref 38–234)

## 2024-03-18 MED ORDER — HYDRALAZINE HCL 20 MG/ML IJ SOLN: 10.0000 mg | Freq: Four times a day (QID) | INTRAMUSCULAR | Status: DC | PRN

## 2024-03-18 MED ORDER — POTASSIUM CHLORIDE CRYS ER 20 MEQ PO TBCR
40.0000 meq | EXTENDED_RELEASE_TABLET | Freq: Once | ORAL | Status: AC
  Administered 2024-03-18: 40 meq via ORAL
  Filled 2024-03-18: qty 2

## 2024-03-18 MED ORDER — POTASSIUM CHLORIDE 10 MEQ/100ML IV SOLN
10.0000 meq | Freq: Once | INTRAVENOUS | Status: AC
  Administered 2024-03-18: 10 meq via INTRAVENOUS
  Filled 2024-03-18: qty 100

## 2024-03-18 MED ORDER — POTASSIUM CHLORIDE 20 MEQ PO PACK
40.0000 meq | PACK | Freq: Once | ORAL | Status: AC
  Administered 2024-03-18: 40 meq via ORAL
  Filled 2024-03-18: qty 2

## 2024-03-18 MED ORDER — SODIUM CHLORIDE 0.9% FLUSH
3.0000 mL | Freq: Two times a day (BID) | INTRAVENOUS | Status: DC
  Administered 2024-03-18 – 2024-03-20 (×4): 3 mL via INTRAVENOUS

## 2024-03-18 MED ORDER — ACETAMINOPHEN 650 MG RE SUPP: 650.0000 mg | Freq: Four times a day (QID) | RECTAL | Status: DC | PRN

## 2024-03-18 MED ORDER — ENOXAPARIN SODIUM 40 MG/0.4ML IJ SOSY
40.0000 mg | PREFILLED_SYRINGE | INTRAMUSCULAR | Status: DC
  Administered 2024-03-18 – 2024-03-19 (×2): 40 mg via SUBCUTANEOUS
  Filled 2024-03-18 (×2): qty 0.4

## 2024-03-18 MED ORDER — LORAZEPAM 2 MG/ML IJ SOLN: 2.0000 mg | INTRAMUSCULAR | Status: DC | PRN

## 2024-03-18 MED ORDER — ONDANSETRON HCL 4 MG/2ML IJ SOLN: 4.0000 mg | Freq: Four times a day (QID) | INTRAMUSCULAR | Status: DC | PRN

## 2024-03-18 MED ORDER — SODIUM CHLORIDE 0.9% FLUSH
3.0000 mL | Freq: Two times a day (BID) | INTRAVENOUS | Status: DC
  Administered 2024-03-18 – 2024-03-20 (×3): 3 mL via INTRAVENOUS

## 2024-03-18 MED ORDER — METOPROLOL TARTRATE 25 MG PO TABS
12.5000 mg | ORAL_TABLET | Freq: Two times a day (BID) | ORAL | Status: DC
  Administered 2024-03-19 – 2024-03-20 (×3): 12.5 mg via ORAL
  Filled 2024-03-18 (×3): qty 1

## 2024-03-18 MED ORDER — SODIUM CHLORIDE 0.9% FLUSH: 3.0000 mL | INTRAVENOUS | Status: DC | PRN

## 2024-03-18 MED ORDER — ACETAMINOPHEN 325 MG PO TABS: 650.0000 mg | ORAL_TABLET | Freq: Four times a day (QID) | ORAL | Status: DC | PRN

## 2024-03-18 MED ORDER — SODIUM CHLORIDE 0.9 % IV SOLN: 250.0000 mL | INTRAVENOUS | Status: AC | PRN

## 2024-03-18 MED ORDER — ONDANSETRON HCL 4 MG PO TABS: 4.0000 mg | ORAL_TABLET | Freq: Four times a day (QID) | ORAL | Status: DC | PRN

## 2024-03-18 MED ORDER — INSULIN ASPART 100 UNIT/ML IJ SOLN: 0.0000 [IU] | Freq: Three times a day (TID) | INTRAMUSCULAR | Status: DC

## 2024-03-18 NOTE — ED Triage Notes (Signed)
 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 Denies thinners.

## 2024-03-18 NOTE — H&P (Signed)
 [facility] History and Physical   Patient: Melody Schaefer FMW:[id number]   PCP: Derick Leita POUR, MD DOB: 06-Jul-1966   DOA: 03/18/2024   DOS: 03/18/2024   DOS: the patient was seen and examined on 03/18/2024  Patient coming from: The patient is coming from Home  Chief Complaint: Syncope and collapse  HPI: Melody Schaefer is a 58 y.o. adult with Past medical history of HTN, HLD, depression, as reviewed from EMR, presented to [facility] ED with syncopal episode.  Patient was at work around 1:15 PM, she went to the restroom and she felt speaking very slowly and then she woke up on the ground around 1:55 PM.  Denied any pain, no urinary or fecal incontinence, no tongue biting.  ((Per chart review, patient was seen in our emergency department on 02/23/2024 after loss of consciousness episode with apparent seizure. Workup unremarkable at that time including CT head. Normal neurologic exam, at mental status baseline. Laceration repaired. Ultimately thought to have had likely convulsive syncope rather than epileptic seizure. Plan for outpatient PMD follow-up. Was completing treatment for sinusitis at that time.))   ED Course: VS afebrile HR 109, RR 18, BP 140/105, 96% on RA BMP: Hypokalemia K 2.7, CO2 21, hyperglycemia BG 1582, calcium 8.3, rest within normal range Troponin 1 negative CBC within normal range CT head: No acute intracranial abnormality.  EKG, sinus tach, inferior infarct, nonspecific ST and T wave changes in the inferior leads  TR[facility] was consulted for admission and further management as below   Review of Systems: as mentioned in the history of present illness.  All other systems reviewed and are negative.  Past Medical History:  Diagnosis Date   Hypertension    Past Surgical History:  Procedure Laterality Date   TUBAL LIGATION     Social History:  reports that Yu has quit smoking. Elzie's smoking use included cigarettes. Karris has never used smokeless tobacco. Breezy  reports current alcohol use. Anavey reports that Chele does not use drugs.  No Known Allergies   Family history reviewed and not pertinent Family History  Problem Relation Age of Onset   Healthy Mother    Cancer Father    Breast cancer Neg Hx      Prior to Admission medications   Medication Sig Start Date End Date Taking? Authorizing Provider  atorvastatin (LIPITOR) 20 MG tablet Take 20 mg by mouth daily.    [provider]  buPROPion (WELLBUTRIN) 75 MG tablet Take 75 mg by mouth 2 (two) times daily.    [provider]  ipratropium (ATROVENT ) 0.06 % nasal spray Place 2 sprays into both nostrils 4 (four) times daily. 01/18/24   Arvis Jolan NOVAK, PA-C  meclizine  (ANTIVERT ) 25 MG tablet Take 1 tablet (25 mg total) by mouth 3 (three) times daily as needed for dizziness. 01/18/24   Arvis Jolan NOVAK, PA-C  pseudoephedrine  (SUDAFED) 60 MG tablet Take 1 tablet (60 mg total) by mouth every 6 (six) hours as needed for congestion. 02/23/24   Jacolyn Pae, MD  triamterene-hydrochlorothiazide (DYAZIDE) 37.5-25 MG capsule Take 1 capsule by mouth daily.    [provider]    Physical Exam: Vitals:   03/18/24 1500 03/18/24 1502 03/18/24 1615  BP:  (!) 140/105 (!) 140/90  Pulse:  (!) 109 90  Resp:  18 18  Temp:  97.8 F (36.6 C)   TempSrc:  Oral   SpO2:  96% 95%  Weight: 95.3 kg    Height: 5' 11 (1.803 m)  General: alert and oriented to time, place, and person. Appear in no acute distress, affect appropriate Eyes: PERRLA, Conjunctiva normal ENT: Oral Mucosa Clear, moist  Neck: no JVD, no Abnormal Mass Or lumps Cardiovascular: S1 and S2 Present, no Murmur, peripheral pulses symmetrical Respiratory: good respiratory effort, Bilateral Air entry equal and Decreased, no signs of accessory muscle use, Clear to Auscultation, no Crackles, no wheezes Abdomen: Bowel Sound opresent, Soft and no tenderness Skin: no rashes  Extremities: no Pedal edema, no calf  tenderness Neurologic: without any new focal findings Gait not checked due to patient safety concerns  Data Reviewed: I have personally reviewed and interpreted labs, imaging as discussed below.  CBC: Recent Labs  Lab 03/18/24 1503  WBC 9.6  NEUTROABS 7.0  HGB 14.6  HCT 42.2  MCV 92.1  PLT 293   Basic Metabolic Panel: Recent Labs  Lab 03/18/24 1503  NA 139  K 2.7*  CL 104  CO2 21*  GLUCOSE 152*  BUN 13  CREATININE 0.69  CALCIUM 8.3*  MG 1.9   GFR: Estimated Creatinine Clearance (by C-G formula based on SCr of 0.69 mg/dL) Female: 03.6 mL/min Female: 117.1 mL/min Liver Function Tests: No results for input(s): AST, ALT, ALKPHOS, BILITOT, PROT, ALBUMIN in the last 168 hours. No results for input(s): LIPASE, AMYLASE in the last 168 hours. No results for input(s): AMMONIA in the last 168 hours. Coagulation Profile: No results for input(s): INR, PROTIME in the last 168 hours. Cardiac Enzymes: No results for input(s): CKTOTAL, CKMB, CKMBINDEX, TROPONINI in the last 168 hours. BNP (last 3 results) No results for input(s): PROBNP in the last 8760 hours. HbA1C: No results for input(s): HGBA1C in the last 72 hours. CBG: No results for input(s): GLUCAP in the last 168 hours. Lipid Profile: No results for input(s): CHOL, HDL, LDLCALC, TRIG, CHOLHDL, LDLDIRECT in the last 72 hours. Thyroid Function Tests: No results for input(s): TSH, T4TOTAL, FREET4, T3FREE, THYROIDAB in the last 72 hours. Anemia Panel: No results for input(s): VITAMINB12, FOLATE, FERRITIN, TIBC, IRON, RETICCTPCT in the last 72 hours. Urine analysis: No results found for: COLORURINE, APPEARANCEUR, LABSPEC, PHURINE, GLUCOSEU, HGBUR, BILIRUBINUR, KETONESUR, PROTEINUR, UROBILINOGEN, NITRITE, LEUKOCYTESUR  Radiological Exams on Admission: No results found. EKG: Independently reviewed. sinus  tachycardia. Echocardiogram: Pending, order placed  I reviewed all nursing notes, pharmacy notes, vitals, pertinent old records.  Assessment/Plan Principal Problem:   Syncope and collapse   # Syncope and collapse, could be due to orthostatic hypotension versus seizures  Continue to monitor on telemetry Continue fall precautions and seizure precautions Ativan  as needed for seizures Follow EEG Follow 2D echocardiogram Check orthostatic vitals Neurology consult tomorrow a.m., consult placed, please call neurology tomorrow a.m.   # Hypokalemia secondary to diuretics Held hydrochlorothiazide home medication.  Consider discontinuing hydrochlorothiazide on discharge Potassium repleted. Monitor electrolytes and replete as needed   # HTN Had home medication for now due to hypokalemia Started metoprolol  12.5 mg p.o. twice daily from tomorrow with holding parameters Monitor BP and titrate medication accordingly Use IV hydralazine  as needed  Hyperglycemia, no history of diabetes Started NovoLog  sliding scale Check hemoglobin A1c Continue diabetic diet Monitor CBG   Nutrition: Carb modified diet DVT Prophylaxis: Subcutaneous Lovenox   Advance goals of care discussion: Full code   Consults: Neurology consult tomorrow a.m.  Family Communication: family was present at bedside, at the time of interview.  Opportunity was given to ask question and all questions were answered satisfactorily.  Disposition: Admitted as inpatient, medical telemetry unit. Likely to  be discharged home, in 1-2 days.  I have discussed plan of care as described above with RN and patient/family.  Severity of Illness: The appropriate patient status for this patient is INPATIENT. Inpatient status is judged to be reasonable and necessary in order to provide the required intensity of service to ensure the patient's safety. The patient's presenting symptoms, physical exam findings, and initial radiographic and  laboratory data in the context of their chronic comorbidities is felt to place them at high risk for further clinical deterioration. Furthermore, it is not anticipated that the patient will be medically stable for discharge from the hospital within 2 midnights of admission.   * I certify that at the point of admission it is my clinical judgment that the patient will require inpatient hospital care spanning beyond 2 midnights from the point of admission due to high intensity of service, high risk for further deterioration and high frequency of surveillance required.*   Author: ELVAN SOR, MD [facility] 03/18/2024 5:25 PM   To reach On-call, see care teams to locate the attending and reach out to them via [email or web address]. If 7PM-7AM, please contact night-coverage If you still have difficulty reaching the attending provider, please page the [facility] (Director on Call) for [facility] on amion for assistance.

## 2024-03-18 NOTE — ED Triage Notes (Signed)
 First Nurse Note:  Pt via [facility] from work. States she felt confused before the syncopal episode. States she woke up on the floor. Pt is A&OX4 and NAD  141 CBG  150/90 BP  110 HR  18 G R hand

## 2024-03-18 NOTE — ED Provider Notes (Signed)
 [facility] Provider Note    Event Date/Time   First MD Initiated Contact with Patient 03/18/24 1607     (approximate)   History   Loss of Consciousness  First Nurse Note:  Pt via [facility] from work. States she felt confused before the syncopal episode. States she woke up on the floor. 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 Denies thinners.    HPI Melody Schaefer is a 58 y.o. adult PMH hypertension presents for evaluation of a loss of consciousness episode - Patient went to work around 115 today, went to the bathroom and then around 125 felt that she was speaking very slowly, subsequently woke up on the ground at about 155.  No pain.  No urinary or fecal incontinence. 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  Daughter is bedside and feels she may be somewhat confused right now compared to baseline. - See summary of recent ED visit below.  Otherwise been in her usual state of health with no recent symptoms.  Per chart review, patient was seen in our emergency department on 02/23/2024 after loss of consciousness episode with apparent seizure.  Workup unremarkable at that time including CT head.  Normal neurologic exam, at mental status baseline.  Laceration repaired.  Ultimately thought to have had likely convulsive syncope rather than epileptic seizure.  Plan for outpatient PMD follow-up.  Was completing treatment for sinusitis at that time.      Physical Exam   Triage Vital Signs: ED Triage Vitals  Encounter Vitals Group     BP 03/18/24 1502 (!) 140/105     Girls Systolic BP Percentile --      Girls Diastolic BP Percentile --      Boys Systolic BP Percentile --      Boys Diastolic BP  Percentile --      Pulse Rate 03/18/24 1502 (!) 109     Resp 03/18/24 1502 18     Temp 03/18/24 1502 97.8 F (36.6 C)     Temp Source 03/18/24 1502 Oral     SpO2 03/18/24 1502 96 %     Weight 03/18/24 1500 210 lb (95.3 kg)     Height 03/18/24 1500 5' 11 (1.803 m)     Head Circumference --      Peak Flow --      Pain Score 03/18/24 1500 0     Pain Loc --      Pain Education --      Exclude from Growth Chart --     Most recent vital signs: Vitals:   03/18/24 1502 03/18/24 1615  BP: (!) 140/105 (!) 140/90  Pulse: (!) 109 90  Resp: 18 18  Temp: 97.8 F (36.6 C)   SpO2: 96% 95%     General: Awake, no distress.  HEENT: Normocephalic, atraumatic CV:  Good peripheral perfusion. RRR, RP 2+ Resp:  Normal effort. CTAB Abd:  No distention. Nontender to deep palpation throughout Other:  Full range of motion of all joints, no tenderness to palpation throughout the bilateral upper and lower extremities. Neuro:  Aox4, CN II-XII intact, FNF wnl, finger taps fast  b/l, 5/5 strength in bilateral finger extension/grip, arm flexion/extension, EHL/FHL. BUE AG 10+ sec no drift, BLE AG 5+ sec no drift. Ambulates with steady gait. SILT. Negative Rhomberg.   ED Results / Procedures / Treatments   Labs (all labs ordered are listed, but only abnormal results are displayed) Labs Reviewed  BASIC METABOLIC PANEL WITH GFR - Abnormal; Notable for the following components:      Result Value   Potassium 2.7 (*)    CO2 21 (*)    Glucose, Bld 152 (*)    Calcium 8.3 (*)    All other components within normal limits  CBC WITH DIFFERENTIAL/PLATELET - Abnormal; Notable for the following components:   Abs Immature Granulocytes 0.08 (*)    All other components within normal limits  MAGNESIUM  PHOSPHORUS  TSH  CK  VITAMIN B12  VITAMIN D  25 HYDROXY (VIT D DEFICIENCY, FRACTURES)  HEMOGLOBIN A1C  CBG MONITORING, ED  TROPONIN I (HIGH SENSITIVITY)     EKG  Ecg = sinus tachycardia, rate 104, no gross  ST elevation or depression, no significant repolarization abnormality, + left axis deviation, normal intervals.  No clear evidence of ischemia nor arrhythmia on my interpretation.   RADIOLOGY N/a    PROCEDURES:  Critical Care performed: No  Procedures   MEDICATIONS ORDERED IN ED: Medications  potassium chloride  10 mEq in 100 mL IVPB (10 mEq Intravenous New Bag/Given 03/18/24 1646)  potassium chloride  SA (KLOR-CON  M) CR tablet 40 mEq (has no administration in time range)  LORazepam  (ATIVAN ) injection 2 mg (has no administration in time range)  potassium chloride  (KLOR-CON ) packet 40 mEq (40 mEq Oral Given 03/18/24 1645)     IMPRESSION / MDM / ASSESSMENT AND PLAN / ED COURSE  I reviewed the triage vital signs and the nursing notes.                              DDX/MDM/AP: Differential diagnosis includes, but is not limited to, recurrent seizure for syncope.  Does sound as if patient may have had a postictal state today though consider possibility of syncope as well.  No external evidence of head trauma and nonfocal neurologic exam here, no indication for repeat head imaging at this time.  Plan: - Labs - EKG - Cardiac monitor - Reassess  Patient's presentation is most consistent with acute presentation with potential threat to life or bodily function.  The patient is on the cardiac monitor to evaluate for evidence of arrhythmia and/or significant heart rate changes.  ED course below.  Workup notable for significant hypokalemia to 2.7, no obvious EKG changes identified however.  Given combination of hypokalemia, recurrent episodes of LOC of unclear etiology within 1 month with no prior history of LOC, I do believe admission would be reasonable for telemetry monitoring and further evaluation.  Magnesium normal, potassium repletion initiated in the emergency department.  Presented to hospitalist team for admission.  Clinical Course as of 03/18/24 1721  Thu Mar 18, 2024  1658 Mag  wnl [MM]    Clinical Course User Index [MM] Clarine Ozell LABOR, MD     FINAL CLINICAL IMPRESSION(S) / ED DIAGNOSES   Final diagnoses:  Loss of consciousness (HCC)  Hypokalemia     Rx / DC Orders   ED Discharge Orders     None        Note:  This document was prepared using Dragon voice recognition software and may include unintentional dictation  errors.   Clarine Ozell LABOR, MD 03/18/24 [phone]

## 2024-03-18 NOTE — ED Notes (Signed)
 See triage note  Presents via EMS s/p syncopal episode   States she woke on the ground  States she only remembers walking into lounge  Denies any n/v or fever

## 2024-03-19 ENCOUNTER — Inpatient Hospital Stay

## 2024-03-19 ENCOUNTER — Inpatient Hospital Stay: Payer: Worker's Compensation

## 2024-03-19 ENCOUNTER — Inpatient Hospital Stay (HOSPITAL_COMMUNITY)
Admit: 2024-03-19 | Discharge: 2024-03-19 | Disposition: A | Discharge status: Home / Self Care | Attending: Student | Admitting: Student

## 2024-03-19 DIAGNOSIS — E538 Deficiency of other specified B group vitamins: Secondary | ICD-10-CM

## 2024-03-19 DIAGNOSIS — E559 Vitamin D deficiency, unspecified: Secondary | ICD-10-CM

## 2024-03-19 DIAGNOSIS — R55 Syncope and collapse: Secondary | ICD-10-CM

## 2024-03-19 DIAGNOSIS — I7774 Dissection of vertebral artery: Secondary | ICD-10-CM

## 2024-03-19 DIAGNOSIS — R569 Unspecified convulsions: Secondary | ICD-10-CM | POA: Diagnosis not present

## 2024-03-19 LAB — ECHOCARDIOGRAM COMPLETE
Area-P 1/2: 4.06 cm2
Height: 71 in
S' Lateral: 2.52 cm
Weight: 3360 [oz_av]

## 2024-03-19 LAB — URINE DRUG SCREEN, QUALITATIVE (ARMC ONLY)
Amphetamines, Ur Screen: NOT DETECTED
Barbiturates, Ur Screen: NOT DETECTED
Benzodiazepine, Ur Scrn: NOT DETECTED
Cannabinoid 50 Ng, Ur ~~LOC~~: NOT DETECTED
Cocaine Metabolite,Ur ~~LOC~~: NOT DETECTED
MDMA (Ecstasy)Ur Screen: NOT DETECTED
Methadone Scn, Ur: NOT DETECTED
Opiate, Ur Screen: NOT DETECTED
Phencyclidine (PCP) Ur S: NOT DETECTED
Tricyclic, Ur Screen: NOT DETECTED

## 2024-03-19 LAB — HIV ANTIBODY (ROUTINE TESTING W REFLEX): HIV Screen 4th Generation wRfx: NONREACTIVE

## 2024-03-19 LAB — CBG MONITORING, ED
Glucose-Capillary: 85 mg/dL (ref 70–99)
Glucose-Capillary: 91 mg/dL (ref 70–99)

## 2024-03-19 LAB — URINALYSIS, ROUTINE W REFLEX MICROSCOPIC
Bilirubin Urine: NEGATIVE
Glucose, UA: NEGATIVE mg/dL
Hgb urine dipstick: NEGATIVE
Ketones, ur: 5 mg/dL — AB
Nitrite: NEGATIVE
Protein, ur: NEGATIVE mg/dL
Specific Gravity, Urine: >1.046 — ABNORMAL HIGH (ref 1.005–1.030)
WBC, UA: >50 WBC/hpf (ref 0–5)
pH: 5 (ref 5.0–8.0)

## 2024-03-19 LAB — BASIC METABOLIC PANEL WITH GFR
Anion gap: 8 (ref 5–15)
BUN: 10 mg/dL (ref 6–20)
CO2: 26 mmol/L (ref 22–32)
Calcium: 8.5 mg/dL — ABNORMAL LOW (ref 8.9–10.3)
Chloride: 106 mmol/L (ref 98–111)
Creatinine, Ser: 0.61 mg/dL (ref 0.44–1.00)
GFR, Estimated: >60 mL/min (ref >60)
Glucose, Bld: 91 mg/dL (ref 70–99)
Potassium: 3.5 mmol/L (ref 3.5–5.1)
Sodium: 140 mmol/L (ref 135–145)

## 2024-03-19 LAB — GLUCOSE, CAPILLARY: Glucose-Capillary: 83 mg/dL (ref 70–99)

## 2024-03-19 LAB — CBC
HCT: 38.4 % (ref 36.0–46.0)
Hemoglobin: 13.2 g/dL (ref 12.0–15.0)
MCH: 32 pg (ref 26.0–34.0)
MCHC: 34.4 g/dL (ref 30.0–36.0)
MCV: 93.2 fL (ref 80.0–100.0)
Platelets: 257 K/uL (ref 150–400)
RBC: 4.12 MIL/uL (ref 3.87–5.11)
RDW: 11.9 % (ref 11.5–15.5)
WBC: 9.4 K/uL (ref 4.0–10.5)
nRBC: 0 % (ref 0.0–0.2)

## 2024-03-19 LAB — HEMOGLOBIN A1C
Hgb A1c MFr Bld: 5.5 % (ref 4.8–5.6)
Mean Plasma Glucose: 111 mg/dL

## 2024-03-19 LAB — PHOSPHORUS: Phosphorus: 3.3 mg/dL (ref 2.5–4.6)

## 2024-03-19 LAB — MAGNESIUM: Magnesium: 2.2 mg/dL (ref 1.7–2.4)

## 2024-03-19 MED ORDER — CLOPIDOGREL BISULFATE 75 MG PO TABS
75.0000 mg | ORAL_TABLET | Freq: Every day | ORAL | Status: DC
  Administered 2024-03-19 – 2024-03-20 (×2): 75 mg via ORAL
  Filled 2024-03-19 (×2): qty 1

## 2024-03-19 MED ORDER — GADOBUTROL 1 MMOL/ML IV SOLN
9.0000 mL | Freq: Once | INTRAVENOUS | Status: AC | PRN
  Administered 2024-03-19: 9 mL via INTRAVENOUS

## 2024-03-19 MED ORDER — IOHEXOL 350 MG/ML SOLN
75.0000 mL | Freq: Once | INTRAVENOUS | Status: AC | PRN
  Administered 2024-03-19: 75 mL via INTRAVENOUS

## 2024-03-19 MED ORDER — ASPIRIN 81 MG PO TBEC
81.0000 mg | DELAYED_RELEASE_TABLET | Freq: Every day | ORAL | Status: DC
  Administered 2024-03-19 – 2024-03-20 (×2): 81 mg via ORAL
  Filled 2024-03-19 (×2): qty 1

## 2024-03-19 NOTE — Progress Notes (Signed)
 Received call from radiology.  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 I would start her on aspirin  and Plavix , and recommend repeating CTA head and neck in 3 to 6 months to reassess. Rest of the workup pending Will follow  -- Eligio Lav, MD Neurologist [facility]

## 2024-03-19 NOTE — Consult Note (Addendum)
 NEUROLOGY CONSULT NOTE   Date of service: March 19, 2024 Patient Name: Melody Schaefer MRN:  [id number] DOB:  Oct 10, 1965 Chief Complaint: Passing out Requesting Provider: Lanetta Lingo, MD  History of Present Illness  Melody Schaefer is a 59 y.o. adult with hx of hypertension presented to the emergency department for evaluation of an episode of loss of consciousness that happened yesterday.  She got to work around 1:15 PM yesterday, went to the bathroom and felt that things were moving around and her speech was slow.  The next thing she knows that she woke up on the ground at around 1:55 PM with no recollection of when she fell or what happened at that time.  No tongue bite, no urinary incontinence. She had an episode similar to this on February 23, 2024 where she had loss of consciousness with possible apparent seizure as well.  She had hit her head and had a black eye and required sutures on small laceration over her forehead.  She said for that episode she was at work and she smelled something like a diesel engine had gone by and after that she does not remember anything.  She does not report any sort of aura with current episode.   ROS  Comprehensive ROS performed and pertinent positives documented in HPI   Past History   Past Medical History:  Diagnosis Date   Hypertension     Past Surgical History:  Procedure Laterality Date   TUBAL LIGATION      Family History: Family History  Problem Relation Age of Onset   Healthy Mother    Cancer Father    Breast cancer Neg Hx     Social History  reports that Melody Schaefer has quit smoking. Melody Schaefer's smoking use included cigarettes. Melody Schaefer has never used smokeless tobacco. Melody Schaefer reports current alcohol use. Melody Schaefer reports that Melody Schaefer does not use drugs.  No Known Allergies  Medications   Current Facility-Administered Medications:    0.9 %  sodium chloride  infusion, 250 mL, Intravenous, PRN, Melody Bellis, MD   acetaminophen   (TYLENOL ) tablet 650 mg, 650 mg, Oral, Q6H PRN **OR** acetaminophen  (TYLENOL ) suppository 650 mg, 650 mg, Rectal, Q6H PRN, Melody Bellis, MD   enoxaparin  (LOVENOX ) injection 40 mg, 40 mg, Subcutaneous, Q24H, Melody Bellis, MD, 40 mg at 03/18/24 2133   hydrALAZINE  (APRESOLINE ) injection 10 mg, 10 mg, Intravenous, Q6H PRN, Melody Bellis, MD   insulin  aspart (novoLOG ) injection 0-9 Units, 0-9 Units, Subcutaneous, TID WC, Melody Bellis, MD   LORazepam  (ATIVAN ) injection 2 mg, 2 mg, Intravenous, Q5 Min x 3 PRN, Melody Bellis, MD   metoprolol  tartrate (LOPRESSOR ) tablet 12.5 mg, 12.5 mg, Oral, BID, Melody Bellis, MD, 12.5 mg at 03/19/24 9078   ondansetron  (ZOFRAN ) tablet 4 mg, 4 mg, Oral, Q6H PRN **OR** ondansetron  (ZOFRAN ) injection 4 mg, 4 mg, Intravenous, Q6H PRN, Melody Bellis, MD   sodium chloride  flush (NS) 0.9 % injection 3 mL, 3 mL, Intravenous, Q12H, Melody Bellis, MD, 3 mL at 03/19/24 9077   sodium chloride  flush (NS) 0.9 % injection 3 mL, 3 mL, Intravenous, Q12H, Melody Bellis, MD, 3 mL at 03/19/24 9077   sodium chloride  flush (NS) 0.9 % injection 3 mL, 3 mL, Intravenous, PRN, Melody Bellis, MD  Current Outpatient Medications:    acetaminophen  (TYLENOL ) 80 MG chewable tablet, Chew 80 mg by mouth every 6 (six) hours as needed., Disp: , Rfl:    atorvastatin (LIPITOR) 20 MG tablet, Take 20 mg by mouth daily., Disp: , Rfl:  triamterene-hydrochlorothiazide (DYAZIDE) 37.5-25 MG capsule, Take 1 capsule by mouth daily., Disp: , Rfl:    buPROPion (WELLBUTRIN) 75 MG tablet, Take 75 mg by mouth 2 (two) times daily., Disp: , Rfl:    ipratropium (ATROVENT ) 0.06 % nasal spray, Place 2 sprays into both nostrils 4 (four) times daily. (Patient not taking: Reported on 2024-04-13), Disp: 15 mL, Rfl: 0   meclizine  (ANTIVERT ) 25 MG tablet, Take 1 tablet (25 mg total) by mouth 3 (three) times daily as needed for dizziness. (Patient not taking: Reported on 04/13/2024), Disp: 30 tablet, Rfl: 0   pseudoephedrine   (SUDAFED) 60 MG tablet, Take 1 tablet (60 mg total) by mouth every 6 (six) hours as needed for congestion. (Patient not taking: Reported on 2024-04-13), Disp: 15 tablet, Rfl: 0  Vitals   Vitals:   03/19/24 0216 03/19/24 0449 03/19/24 0800 03/19/24 0938  BP: 112/61 116/66  (!) 143/68  Pulse: 73 79  71  Resp: 13 18  18   Temp:  98.3 F (36.8 C)  98.3 F (36.8 C)  TempSrc:  Oral Oral Oral  SpO2: 94% 95%  94%  Weight:      Height:        Body mass index is 29.29 kg/m.   Physical Exam   GENERAL: Awake, alert in NAD HEENT: - Normocephalic and atraumatic, dry mm, no LN++, no Thyromegally LUNGS - Clear to auscultation bilaterally with no wheezes CV - S1S2 RRR, no m/r/g, equal pulses bilaterally. ABDOMEN - Soft, nontender, nondistended with normoactive BS NEURO:  Mental Status: AA&Ox3 Speech and Language: speech is not dysarthric.  Naming, repetition, fluency, and comprehension intact. Cranial Nerves: PERRL. EOMI, visual fields full, no facial asymmetry, facial sensation intact, hearing intact, tongue/uvula/soft palate midline, normal sternocleidomastoid and trapezius muscle strength. No evidence of tongue atrophy or fibrillations Motor: Tone: is normal and bulk is normal Sensation- Intact to light touch bilaterally Coordination: FTN intact bilaterally, no ataxia in BLE. Gait- deferred  Labs/Imaging/Neurodiagnostic studies   CBC:  Recent Labs  Lab 04/13/2024 1503 03/19/24 0439  WBC 9.6 9.4  NEUTROABS 7.0  --   HGB 14.6 13.2  HCT 42.2 38.4  MCV 92.1 93.2  PLT 293 257   Basic Metabolic Panel:  Lab Results  Component Value Date   NA 140 03/19/2024   K 3.5 03/19/2024   CO2 26 03/19/2024   GLUCOSE 91 03/19/2024   BUN 10 03/19/2024   CREATININE 0.61 03/19/2024   CALCIUM 8.5 (L) 03/19/2024   GFRNONAA >60 03/19/2024  B12 187, vitamin D  20.22, TSH 2.088 phosphorus 2.2 CK 76 High sensitivity troponin 7  CT Head without contrast(Personally reviewed): 02/23/2024-noncontrast  CT head-no acute intracranial abnormality.  ASSESSMENT   Melody Schaefer is a 58 y.o. woman who has a past medical history of hypertension and an episode of passing out with possible seizure activity in June of this year who presents again with an episode suspicious for syncope versus seizure.  No witnessed seizure activity but she has a pretty large.  Of time where she has no recollection of what happened and why she was on the ground at work. This being the second episode with pretty much same semiology, suspicion for seizure is high. . Interestingly, her labs also reveal B12 level of 187 which is significantly low from a neurological standpoint.  She also has vitamin D  deficiency  Impression:  Evaluate for seizure  Evaluate for syncope B12 deficiency Vitamin D  deficiency  RECOMMENDATIONS  Seizure precautions Telemetry Frequent neurochecks MRI brain with  and without contrast Routine EEG 2D echo Lower on the differential, VBI-will order CTA head and neck Urinary drug screen and urinalysis to rule out infectious process causing syncope B12 repletion for goal greater than 400 Vitamin D  repletion for goal level normal I did discuss driving restrictions and seizure precautions with her.  Plan relayed to hospitalist Dr. Lanetta ______________________________________________________________________    Signed, Eligio Lav, MD [facility]  SEIZURE PRECAUTIONS Per House  [place] statutes, patients with seizures are not allowed to drive until they have been seizure-free for six months.   Use caution when using heavy equipment or power tools. Avoid working on ladders or at heights. Take showers instead of baths. Ensure the water temperature is not too high on the home water heater. Do not go swimming alone. Do not lock yourself in a room alone (i.e. bathroom). When caring for infants or small children, sit down when holding, feeding, or changing them to minimize risk of  injury to the child in the event you have a seizure. Maintain good sleep hygiene. Avoid alcohol.    If patient has another seizure, call 911 and bring them back to the ED if: A.  The seizure lasts longer than 5 minutes.      B.  The patient doesn't wake shortly after the seizure or has new problems such as difficulty seeing, speaking or moving following the seizure C.  The patient was injured during the seizure D.  The patient has a temperature over 102 F (39C) E.  The patient vomited during the seizure and now is having trouble breathing

## 2024-03-19 NOTE — Procedures (Signed)
 Patient Name: Melody Schaefer  MRN: [id number]  Epilepsy Attending: Arlin MALVA Krebs  Referring Physician/Provider: Von Bellis, MD  Date: 03/19/2024  Duration: 26.35 mins  Patient history:  58 y.o. adult with hx of hypertension presented to the emergency department for evaluation of an episode of loss of consciousness that happened yesterday.  EEG to evaluate for seizure  Level of alertness: Awake, drowsy  AEDs during EEG study: None  Technical aspects: This EEG study was done with scalp electrodes positioned according to the 10-20 International system of electrode placement. Electrical activity was reviewed with band pass filter of 1-70Hz , sensitivity of 7 uV/mm, display speed of 23mm/sec with a 60Hz  notched filter applied as appropriate. EEG data were recorded continuously and digitally stored.  Video monitoring was available and reviewed as appropriate.  Description: The posterior dominant rhythm consists of 9-10 Hz activity of moderate voltage (25-35 uV) seen predominantly in posterior head regions, symmetric and reactive to eye opening and eye closing. Drowsiness was characterized by attenuation of the posterior background rhythm. Hyperventilation and photic stimulation were not performed.     IMPRESSION: This study is within normal limits. No seizures or epileptiform discharges were seen throughout the recording.  A normal interictal EEG does not exclude the diagnosis of epilepsy.   Melody Schaefer

## 2024-03-19 NOTE — Progress Notes (Signed)
 Progress Note   Patient: Melody Schaefer FMW:[id number] DOB: 1965/10/05 DOA: 03/18/2024     1 DOS: the patient was seen and examined on 03/19/2024   Brief hospital course:  Melody Schaefer is a 58 y.o. adult with Past medical history of HTN, HLD, depression, as reviewed from EMR, presented to [facility] ED with syncopal episode.  Patient was at work around 1:15 PM, she went to the restroom and she felt speaking very slowly and then she woke up on the ground around 1:55 PM.  Denied any pain, no urinary or fecal incontinence, no tongue biting.   ((Per chart review, patient was seen in our emergency department on 02/23/2024 after loss of consciousness episode with apparent seizure. Workup unremarkable at that time including CT head. Normal neurologic exam, at mental status baseline. Laceration repaired. Ultimately thought to have had likely convulsive syncope rather than epileptic seizure. Plan for outpatient PMD follow-up. Was completing treatment for sinusitis at that time.))     ED Course: VS afebrile HR 109, RR 18, BP 140/105, 96% on RA BMP: Hypokalemia K 2.7, CO2 21, hyperglycemia BG 1582, calcium 8.3, rest within normal range Troponin 1 negative CBC within normal range CT head: No acute intracranial abnormality.  EKG, sinus tach, inferior infarct, nonspecific ST and T wave changes in the inferior leads   TRH was consulted for admission and further management as below       Assessment and Plan:  Concern for seizure activity Patient presented to the emergency room for evaluation of an episode of passing out with a period of inability to recollect what happened This is the patient's second episode and suspicion for seizure is quite high Appreciate neurology input MRI of the brain shows several small foci of T2 FLAIR hyperintense signal abnormality within the cerebral white matter, nonspecific but most often secondary to chronic small vessel ischemia.  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 Appreciate neurology input, continue aspirin  and Plavix  Recommend repeat CTA head and neck in 3 to 6 months to reassess    ??  Syncope Concern for possible hypotension related to diuretic use Orthostatic blood pressure checks Follow-up results of 2D echocardiogram    Hypokalemia Secondary to diuretic therapy Continue to hold hydrochlorothiazide/triamterene Supplement potassium Check magnesium levels   Hypertension Blood pressure is stable Continue low-dose beta-blocker    Hyperglycemia Present on admission Probably related to stress Blood sugars have been stable      Subjective: No new complaints  Physical Exam: Vitals:   03/19/24 0800 03/19/24 0938 03/19/24 1159 03/19/24 1350  BP:  (!) 143/68 (!) 145/91 129/78  Pulse:  71  66  Resp:  18  18  Temp:  98.3 F (36.8 C) 98.9 F (37.2 C) 98 F (36.7 C)  TempSrc: Oral Oral Oral   SpO2:  94%  95%  Weight:      Height:       General: alert and oriented to time, place, and person. Appear in no acute distress, affect appropriate Eyes: PERRLA, Conjunctiva normal ENT: Oral Mucosa Clear, moist  Neck: no JVD, no Abnormal Mass Or lumps Cardiovascular: S1 and S2 Present, no Murmur, peripheral pulses symmetrical Respiratory: good respiratory effort, Bilateral Air entry equal  Abdomen: Bowel Sound opresent, Soft and no tenderness Skin: no rashes  Extremities: no Pedal edema, no calf tenderness Neurologic: without any new focal findings Gait not checked due  to patient safety concerns   Data Reviewed: Potassium 3.5 Labs reviewed  Family Communication: Plan of care discussed with patient at the bedside.  She verbalizes understanding and agrees with the plan.  Disposition: Status is: Inpatient Remains inpatient appropriate because: Further workup to rule out seizures  Planned  Discharge Destination: Home    Time spent: 45 minutes  Author: Aimee Somerset, MD 03/19/2024 3:56 PM  For on call review [email or web address].

## 2024-03-19 NOTE — Plan of Care (Signed)
  Problem: Education: Goal: Ability to describe self-care measures that may prevent or decrease complications (Diabetes Survival Skills Education) will improve Outcome: Progressing   Problem: Coping: Goal: Ability to adjust to condition or change in health will improve Outcome: Progressing   Problem: Fluid Volume: Goal: Ability to maintain a balanced intake and output will improve Outcome: Progressing   Problem: Health Behavior/Discharge Planning: Goal: Ability to identify and utilize available resources and services will improve Outcome: Progressing   Problem: Metabolic: Goal: Ability to maintain appropriate glucose levels will improve Outcome: Progressing   Problem: Nutritional: Goal: Maintenance of adequate nutrition will improve Outcome: Progressing   Problem: Skin Integrity: Goal: Risk for impaired skin integrity will decrease Outcome: Progressing   Problem: Tissue Perfusion: Goal: Adequacy of tissue perfusion will improve Outcome: Progressing   Problem: Education: Goal: Knowledge of General Education information will improve Description: Including pain rating scale, medication(s)/side effects and non-pharmacologic comfort measures Outcome: Progressing   Problem: Activity: Goal: Risk for activity intolerance will decrease Outcome: Progressing   Problem: Nutrition: Goal: Adequate nutrition will be maintained Outcome: Progressing   Problem: Coping: Goal: Level of anxiety will decrease Outcome: Progressing   Problem: Elimination: Goal: Will not experience complications related to bowel motility Outcome: Progressing   Problem: Pain Managment: Goal: General experience of comfort will improve and/or be controlled Outcome: Progressing   Problem: Safety: Goal: Ability to remain free from injury will improve Outcome: Progressing   Problem: Skin Integrity: Goal: Risk for impaired skin integrity will decrease Outcome: Progressing

## 2024-03-19 NOTE — Progress Notes (Signed)
 Eeg done

## 2024-03-20 DIAGNOSIS — G450 Vertebro-basilar artery syndrome: Secondary | ICD-10-CM

## 2024-03-20 DIAGNOSIS — N390 Urinary tract infection, site not specified: Secondary | ICD-10-CM

## 2024-03-20 LAB — BASIC METABOLIC PANEL WITH GFR
Anion gap: 8 (ref 5–15)
BUN: 12 mg/dL (ref 6–20)
CO2: 26 mmol/L (ref 22–32)
Calcium: 8.6 mg/dL — ABNORMAL LOW (ref 8.9–10.3)
Chloride: 106 mmol/L (ref 98–111)
Creatinine, Ser: 0.71 mg/dL (ref 0.44–1.00)
GFR, Estimated: >60 mL/min (ref >60)
Glucose, Bld: 85 mg/dL (ref 70–99)
Potassium: 3.5 mmol/L (ref 3.5–5.1)
Sodium: 140 mmol/L (ref 135–145)

## 2024-03-20 LAB — CBC
HCT: 38.7 % (ref 36.0–46.0)
Hemoglobin: 13.6 g/dL (ref 12.0–15.0)
MCH: 32.4 pg (ref 26.0–34.0)
MCHC: 35.1 g/dL (ref 30.0–36.0)
MCV: 92.1 fL (ref 80.0–100.0)
Platelets: 244 K/uL (ref 150–400)
RBC: 4.2 MIL/uL (ref 3.87–5.11)
RDW: 12.1 % (ref 11.5–15.5)
WBC: 6.4 K/uL (ref 4.0–10.5)
nRBC: 0 % (ref 0.0–0.2)

## 2024-03-20 LAB — PHOSPHORUS: Phosphorus: 4 mg/dL (ref 2.5–4.6)

## 2024-03-20 LAB — MAGNESIUM: Magnesium: 2.2 mg/dL (ref 1.7–2.4)

## 2024-03-20 MED ORDER — ACETAMINOPHEN 325 MG PO TABS: 650.0000 mg | ORAL_TABLET | Freq: Four times a day (QID) | ORAL | Status: AC | PRN

## 2024-03-20 MED ORDER — VITAMIN D (ERGOCALCIFEROL) 1.25 MG (50000 UNIT) PO CAPS
50000.0000 [IU] | ORAL_CAPSULE | ORAL | Status: DC
  Administered 2024-03-20: 50000 [IU] via ORAL
  Filled 2024-03-20: qty 1

## 2024-03-20 MED ORDER — VITAMIN D (ERGOCALCIFEROL) 1.25 MG (50000 UNIT) PO CAPS: 50000.0000 [IU] | ORAL_CAPSULE | ORAL | 0 refills | Status: AC

## 2024-03-20 MED ORDER — CYANOCOBALAMIN 1000 MCG PO TABS: 1000.0000 ug | ORAL_TABLET | Freq: Every day | ORAL | 0 refills | Status: AC

## 2024-03-20 MED ORDER — SODIUM CHLORIDE 0.9 % IV SOLN
1.0000 g | INTRAVENOUS | Status: DC
  Administered 2024-03-20: 1 g via INTRAVENOUS
  Filled 2024-03-20: qty 10

## 2024-03-20 MED ORDER — CYANOCOBALAMIN 1000 MCG/ML IJ SOLN
1000.0000 ug | Freq: Every day | INTRAMUSCULAR | Status: DC
  Administered 2024-03-20: 1000 ug via INTRAMUSCULAR
  Filled 2024-03-20: qty 1

## 2024-03-20 MED ORDER — AMOXICILLIN-POT CLAVULANATE 875-125 MG PO TABS
1.0000 | ORAL_TABLET | Freq: Two times a day (BID) | ORAL | 0 refills | Status: AC
Start: 2024-03-21 — End: 2024-03-25

## 2024-03-20 MED ORDER — CLOPIDOGREL BISULFATE 75 MG PO TABS: 75.0000 mg | ORAL_TABLET | Freq: Every day | ORAL | 2 refills | Status: AC

## 2024-03-20 MED ORDER — POTASSIUM CHLORIDE CRYS ER 20 MEQ PO TBCR
40.0000 meq | EXTENDED_RELEASE_TABLET | Freq: Once | ORAL | Status: AC
  Administered 2024-03-20: 40 meq via ORAL
  Filled 2024-03-20: qty 2

## 2024-03-20 MED ORDER — ASPIRIN 81 MG PO TBEC: 81.0000 mg | DELAYED_RELEASE_TABLET | Freq: Every day | ORAL | 12 refills | Status: AC

## 2024-03-20 MED ORDER — LOSARTAN POTASSIUM 25 MG PO TABS: 25.0000 mg | ORAL_TABLET | Freq: Every day | ORAL | Status: DC

## 2024-03-20 MED ORDER — SODIUM CHLORIDE 0.9 % IV SOLN: 1.0000 g | INTRAVENOUS | Status: DC

## 2024-03-20 MED ORDER — LOSARTAN POTASSIUM 25 MG PO TABS
25.0000 mg | ORAL_TABLET | Freq: Every day | ORAL | 11 refills | Status: AC
Start: 2024-03-21 — End: 2025-03-21

## 2024-03-20 MED ORDER — VITAMIN B-12 1000 MCG PO TABS: 1000.0000 ug | ORAL_TABLET | Freq: Every day | ORAL | Status: DC

## 2024-03-20 NOTE — Discharge Summary (Signed)
 [facility] Discharge Summary   Patient: Melody Schaefer FMW:[id number]  PCP: Derick Leita POUR, MD  Date of admission: 03/18/2024   Date of discharge:  03/20/2024     Discharge Diagnoses:  Principal Problem:   Syncope and collapse   Admitted From: Home Disposition:  Home   Recommendations for Outpatient Follow-up:  F/u with PCP in  1 wk, monitor BP and titrate meds.   F/u with Neurologist in 1 wk Follow up LABS/TEST:  F/u Urine Culture report, still pending Repeat B12 and Vit D level after 3 months   Follow-up Information     Derick Leita POUR, MD Follow up in 1 week(s).   Specialty: Family Medicine Contact information: [address] [place] [address] [phone]         Maree Jannett POUR, MD Follow up in 1 week(s).   Specialty: Neurology Contact information: [address] [facility] [place] [address] [phone]                Diet recommendation: Cardiac diet  Activity: The patient is advised to gradually reintroduce usual activities, as tolerated  Discharge Condition: stable  Code Status: Full code   History of present illness: As per the H and P dictated on admission. Hospital Course:  Melody Schaefer is a 58 y.o. adult with Past medical history of HTN, HLD, depression, as reviewed from EMR, presented to [facility] ED with syncopal episode.  Patient was at work around 1:15 PM, she went to the restroom and she felt speaking very slowly and then she woke up on the ground around 1:55 PM.  Denied any pain, no urinary or fecal incontinence, no tongue biting.   ((Per chart review, patient was seen in our emergency department on 02/23/2024 after loss of consciousness episode with apparent seizure. Workup unremarkable at that time including CT head. Normal neurologic exam, at mental status baseline. Laceration repaired. Ultimately thought to have had likely convulsive syncope rather than epileptic seizure. Plan for outpatient PMD  follow-up. Was completing treatment for sinusitis at that time.))     ED Course: VS afebrile HR 109, RR 18, BP 140/105, 96% on RA BMP: Hypokalemia K 2.7, CO2 21, hyperglycemia BG 1582, calcium 8.3, rest within normal range Troponin 1 negative CBC within normal range CT head: No acute intracranial abnormality.  EKG, sinus tach, inferior infarct, nonspecific ST and T wave changes in the inferior leads   TRH was consulted for admission and further management as below    Assessment and Plan:   # Concern for seizure activity Patient presented to the emergency room for evaluation of an episode of passing out with a period of inability to recollect what happened This is the patient's second episode.  Neurology was consulted.  EEG within normal limits, no seizures or epileptiform discharges were seen throughout the recording. MRI of the brain shows several small foci of T2 FLAIR hyperintense signal abnormality within the cerebral white matter, nonspecific but most often secondary to chronic small vessel ischemia.  Unclear significance. Patient remained stable during hospital stay, no seizure activities, no headache or dizziness.  Ambulated well without any concerns.     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 Appreciate neurology input, continue aspirin  and Plavix  Recommend repeat CTA head and neck in 3 to 6 months to reassess Follow-up with neurology as an outpatient in 1 week.   # Syncope and collapse, multifactorial as above, could be due  to electrolyte imbalance and dehydration.  Orthostatics negative.  TTE LVEF 6065%, no WMA, No significant valvular abnormality.  Currently patient is asymptomatic. Continue treatment as above.   # Hypokalemia, Secondary to diuretic therapy Discontinued hydrochlorothiazide/triamterene on discharge. Potassium repleted and hypokalemia-resolved   # Hypertension: Discontinued Dyazide. S/p Lopressor   given during hospital stay but noticed bradycardia so discontinued.  Started losartan  25 mg p.o. daily.  Patient was advised to monitor BP at home and follow with PCP to titrate medication accordingly.   # Hyperglycemia, POA. Probably related to stress Blood sugars have been stable  # Vitamin D  deficiency: VD level 20.22, started vitamin D  50,000 units p.o. weekly, follow with PCP to repeat vitamin D  level after 3 to 6 months.  # Vitamin B12 deficiency: B12 level 187, goal >400, s/p vitamin B12 1000 mcg IM injection x 1 dose, followed by oral supplement.  Follow-up PCP to repeat vitamin B12 level after 3 to 6 months.  # UTI, UA positive.  S/p ceftriaxone , discharged on Augmentin  twice daily for 4 days.  Urine culture growing multiple species, suggested recollection.  Patient was advised to give another urine sample before discharge and follow-up with PCP for culture report.   Body mass index is 29.29 kg/m.  Nutrition Interventions:  - Patient was instructed, not to drive, operate heavy machinery, perform activities at heights, swimming or participation in water activities or provide baby sitting services while on Pain, Sleep and Anxiety Medications; until Melody Schaefer's outpatient Physician has advised to do so again.  - Also recommended to not to take more than prescribed Pain, Sleep and Anxiety Medications.  Patient was ambulatory without any assistance. On the day of the discharge the patient's vitals were stable, and no other acute medical condition were reported by patient. the patient was felt safe to be discharge at Home.  Consultants: Neurologist Procedures: None  Discharge Exam: General: Appear in no distress, no Rash; Oral Mucosa Clear, moist. Cardiovascular: S1 and S2 Present, no Murmur, Respiratory: normal respiratory effort, Bilateral Air entry present and no Crackles, no wheezes Abdomen: Bowel Sound present, Soft and no tenderness, no hernia Extremities: no Pedal edema, no calf  tenderness Neurology: alert and oriented to time, place, and person affect appropriate.  Filed Weights   03/18/24 1500  Weight: 95.3 kg   Vitals:   03/20/24 0353 03/20/24 0752  BP: 118/76 133/68  Pulse: (!) 56 (!) 59  Resp: 18 16  Temp: 97.8 F (36.6 C) 98.6 F (37 C)  SpO2: 97% 95%    DISCHARGE MEDICATION: Allergies as of 03/20/2024   No Known Allergies      Medication List     STOP taking these medications    acetaminophen  80 MG chewable tablet Commonly known as: TYLENOL  Replaced by: acetaminophen  325 MG tablet   ipratropium 0.06 % nasal spray Commonly known as: ATROVENT    meclizine  25 MG tablet Commonly known as: ANTIVERT    pseudoephedrine  60 MG tablet Commonly known as: SUDAFED   triamterene-hydrochlorothiazide 37.5-25 MG capsule Commonly known as: DYAZIDE       TAKE these medications    acetaminophen  325 MG tablet Commonly known as: TYLENOL  Take 2 tablets (650 mg total) by mouth every 6 (six) hours as needed for mild pain (pain score 1-3), fever, headache or moderate pain (pain score 4-6) (or Fever >/= 101). Replaces: acetaminophen  80 MG chewable tablet   amoxicillin -clavulanate 875-125 MG tablet Commonly known as: AUGMENTIN  Take 1 tablet by mouth 2 (two) times daily for 4 days. Start taking  on: March 21, 2024   aspirin  EC 81 MG tablet Take 1 tablet (81 mg total) by mouth daily. Swallow whole. Start taking on: March 21, 2024   atorvastatin 20 MG tablet Commonly known as: LIPITOR Take 20 mg by mouth daily.   buPROPion 75 MG tablet Commonly known as: WELLBUTRIN Take 75 mg by mouth 2 (two) times daily.   clopidogrel  75 MG tablet Commonly known as: PLAVIX  Take 1 tablet (75 mg total) by mouth daily. Start taking on: March 21, 2024   cyanocobalamin  1000 MCG tablet Take 1 tablet (1,000 mcg total) by mouth daily. Start taking on: March 27, 2024   losartan  25 MG tablet Commonly known as: COZAAR  Take 1 tablet (25 mg total) by mouth  daily. Start taking on: March 21, 2024   Vitamin D  (Ergocalciferol ) 1.25 MG (50000 UNIT) Caps capsule Commonly known as: DRISDOL  Take 1 capsule (50,000 Units total) by mouth every 7 (seven) days. Start taking on: March 27, 2024       No Known Allergies Discharge Instructions     Ambulatory referral to Neurology   Complete by: As directed    Within 1 week   Call MD for:   Complete by: As directed    Any Neurological symptoms   Call MD for:  difficulty breathing, headache or visual disturbances   Complete by: As directed    Call MD for:  extreme fatigue   Complete by: As directed    Call MD for:  persistant dizziness or light-headedness   Complete by: As directed    Call MD for:  severe uncontrolled pain   Complete by: As directed    Call MD for:  temperature >100.4   Complete by: As directed    Diet - low sodium heart healthy   Complete by: As directed    Discharge instructions   Complete by: As directed    F/u with PCP in  1 wk, monitor BP and titrate meds.   F/u with Neurologist in 1 wk F/u Urine Culture report, still pending Repeat B12 and Vit D level after 3 months   Increase activity slowly   Complete by: As directed        The results of significant diagnostics from this hospitalization (including imaging, microbiology, ancillary and laboratory) are listed below for reference.    Significant Diagnostic Studies: EEG adult Result Date: 03/19/2024 Shelton Arlin KIDD, MD     03/19/2024  4:04 PM Patient Name: ARIONNE IAMS MRN: [id number] Epilepsy Attending: Arlin KIDD Shelton Referring Physician/Provider: Von Bellis, MD Date: 03/19/2024 Duration: 26.35 mins Patient history:  58 y.o. adult with hx of hypertension presented to the emergency department for evaluation of an episode of loss of consciousness that happened yesterday.  EEG to evaluate for seizure Level of alertness: Awake, drowsy AEDs during EEG study: None Technical aspects: This EEG study was done with scalp  electrodes positioned according to the 10-20 International system of electrode placement. Electrical activity was reviewed with band pass filter of 1-70Hz , sensitivity of 7 uV/mm, display speed of 66mm/sec with a 60Hz  notched filter applied as appropriate. EEG data were recorded continuously and digitally stored.  Video monitoring was available and reviewed as appropriate. Description: The posterior dominant rhythm consists of 9-10 Hz activity of moderate voltage (25-35 uV) seen predominantly in posterior head regions, symmetric and reactive to eye opening and eye closing. Drowsiness was characterized by attenuation of the posterior background rhythm. Hyperventilation and photic stimulation were not performed.   IMPRESSION:  This study is within normal limits. No seizures or epileptiform discharges were seen throughout the recording. A normal interictal EEG does not exclude the diagnosis of epilepsy. Arlin MALVA Krebs   MR BRAIN W WO CONTRAST Result Date: 03/19/2024 CLINICAL DATA:  Provided history: Seizure disorder, clinical change. EXAM: MRI HEAD WITHOUT AND WITH CONTRAST TECHNIQUE: Multiplanar, multiecho pulse sequences of the brain and surrounding structures were obtained without and with intravenous contrast. CONTRAST:  9mL GADAVIST  GADOBUTROL  1 MMOL/ML IV SOLN COMPARISON:  CT angiogram head/neck 02/18/2024. FINDINGS: Mild intermittent motion degradation. Within this limitation, findings are as follows. Brain: No age-advanced or lobar predominant cerebral atrophy. Several small foci of T2 FLAIR hyperintense signal abnormality scattered within the bilateral cerebral white matter, nonspecific but most often secondary to chronic small vessel ischemia. Partially empty sella turcica. No cortical encephalomalacia is identified. No appreciable hippocampal size or signal asymmetry. There is no acute infarct. No evidence of an intracranial mass. No chronic intracranial blood products. No extra-axial fluid collection.  No midline shift. No pathologic intracranial enhancement identified. Vascular: Maintained flow voids within the proximal large arterial vessels. Small developmental venous anomaly within the left occipital lobe (anatomic variant). Skull and upper cervical spine: No focal worrisome marrow lesion. Sinuses/Orbits: No mass or acute finding within the imaged orbits. 10 mm mucous retention cyst within the right sphenoid sinus. IMPRESSION: 1. Mildly motion degraded exam. 2. No evidence of an acute intracranial abnormality. 3. No specific seizure etiology is identified. 4. Several small foci of T2 FLAIR hyperintense signal abnormality within the cerebral white matter, nonspecific but most often secondary to chronic small vessel ischemia. 5. 10 mm mucous retention cyst within the right sphenoid sinus. Electronically Signed   By: Rockey Childs D.O.   On: 03/19/2024 15:42   CT ANGIO HEAD NECK W WO CM Addendum Date: 03/19/2024 ADDENDUM REPORT: 03/19/2024 13:39 ADDENDUM: These results were called by telephone at the time of interpretation on 03/19/2024 at 1:39 pm to provider [facility] , who verbally acknowledged these results. Electronically Signed   By: Donnice Mania M.D.   On: 03/19/2024 13:39   Result Date: 03/19/2024 CLINICAL DATA:  Syncope/presyncope, cerebrovascular cause suspected. EXAM: CT ANGIOGRAPHY HEAD AND NECK WITH AND WITHOUT CONTRAST TECHNIQUE: Multidetector CT imaging of the head and neck was performed using the standard protocol during bolus administration of intravenous contrast. Multiplanar CT image reconstructions and MIPs were obtained to evaluate the vascular anatomy. Carotid stenosis measurements (when applicable) are obtained utilizing NASCET criteria, using the distal internal carotid diameter as the denominator. RADIATION DOSE REDUCTION: This exam was performed according to the departmental dose-optimization program which includes automated exposure control, adjustment of the mA and/or kV  according to patient size and/or use of iterative reconstruction technique. CONTRAST:  75mL OMNIPAQUE  IOHEXOL  350 MG/ML SOLN COMPARISON:  CT head 02/23/2024. FINDINGS: CT HEAD FINDINGS Brain: No acute intracranial hemorrhage. No CT evidence of acute infarct. No edema, mass effect, or midline shift. The basilar cisterns are patent. Ventricles: Prominence of the ventricles suggesting underlying parenchymal volume loss. Vascular: No hyperdense vessel or unexpected calcification. Skull: No acute or aggressive finding. Orbits: Orbits are symmetric. Sinuses: Mucous retention cyst in the right sphenoid sinus. Other: Mastoid air cells are clear. CTA NECK FINDINGS Aortic arch: Standard configuration of the aortic arch. Imaged portion shows no evidence of aneurysm or dissection. No significant stenosis of the major arch vessel origins. Pulmonary arteries: As permitted by contrast timing, there are no filling defects in the visualized pulmonary arteries. Subclavian arteries: The  subclavian arteries are patent bilaterally. Right carotid system: No evidence of dissection, stenosis (50% or greater), or occlusion. Left carotid system: No evidence of dissection, stenosis (50% or greater), or occlusion. Minimal atherosclerosis at the carotid bifurcation. Vertebral arteries: The right vertebral artery is dominant and is patent from the origin to the vertebrobasilar confluence. Mild tortuosity of the right V1 segment. The left vertebral artery is significantly diminished in caliber with multifocal irregularity along its course from the V1 segment to the distal V2 segment. The left vertebral artery is occluded at the level of C1-2. There is faint contrast within the left vertebral artery at the V3/V4 junction. The left V4 segment is diminutive in caliber but appears patent. Skeleton: No acute findings. Degenerative changes in the spine particularly within the visualized upper thoracic spine. Other neck: The visualized airway is patent.  No cervical lymphadenopathy. Hypoattenuating nodule in the left thyroid lobe measuring up to 1.2 cm, no follow-up required. Upper chest: Centrilobular emphysema in the lung apices. Review of the MIP images confirms the above findings CTA HEAD FINDINGS ANTERIOR CIRCULATION: The intracranial internal carotid arteries are patent bilaterally. Atherosclerosis of the right carotid siphon with mild stenosis along the right cavernous ICA. Minimal atherosclerosis of the left carotid siphon. MCAs: The right MCA is patent. Duplication versus early branching of the right M1 segment. ACAs: The anterior cerebral arteries are patent bilaterally. POSTERIOR CIRCULATION: PCAs: Patent bilaterally.  Fetal origin of the left PCA. Pcomm: Visualized on the left. SCAs: The superior cerebellar arteries are patent bilaterally. Basilar artery: Patent AICAs: Patent PICAs: Patent Vertebral arteries: As above. Venous sinuses: As permitted by contrast timing, patent. Anatomic variants: None Review of the MIP images confirms the above findings IMPRESSION: Diminutive caliber of the left vertebral artery with multifocal severe narrowing of the vessel along the V1 and V2 segments. 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 Reconstitution of the vessel at the V3/V4 junction. 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 No evidence of intracranial propagation. Patent intracranial arterial vasculature. Atherosclerosis in the right carotid siphon resulting in mild stenosis of the right cavernous ICA. No CT evidence of acute intracranial abnormality. Electronically Signed: By: Donnice Mania M.D. On: 03/19/2024 13:31   ECHOCARDIOGRAM COMPLETE Result Date: 03/19/2024    ECHOCARDIOGRAM REPORT   Patient Name:   BARNIE DELENA BLUSH Date of Exam: 03/19/2024 Medical Rec #:  [id number]        Height:       71.0 in Accession #:    [id number]       Weight:       210.0 lb Date of Birth:  11/17/1965         BSA:          2.153 m Patient Age:     58 years         BP:           143/68 mmHg Patient Gender: F                HR:           60 bpm. Exam Location:  [facility] Procedure: 2D Echo, Cardiac Doppler and Color Doppler (Both Spectral and Color            Flow Doppler were utilized during procedure). Indications:     Syncope  History:         Patient has no prior history of Echocardiogram examinations.  Signs/Symptoms:Syncope.  Sonographer:     Philomena Daring Referring Phys:  [id number] IPOZZE XLFJM Diagnosing Phys: Redell Cave MD IMPRESSIONS  1. Left ventricular ejection fraction, by estimation, is 60 to 65%. The left ventricle has normal function. The left ventricle has no regional wall motion abnormalities. There is mild left ventricular hypertrophy. Left ventricular diastolic parameters were normal.  2. Right ventricular systolic function is normal. The right ventricular size is normal.  3. The mitral valve is normal in structure. Trivial mitral valve regurgitation.  4. The aortic valve was not well visualized. Aortic valve regurgitation is not visualized.  5. The inferior vena cava is normal in size with greater than 50% respiratory variability, suggesting right atrial pressure of 3 mmHg. FINDINGS  Left Ventricle: Left ventricular ejection fraction, by estimation, is 60 to 65%. The left ventricle has normal function. The left ventricle has no regional wall motion abnormalities. The left ventricular internal cavity size was normal in size. There is  mild left ventricular hypertrophy. Left ventricular diastolic parameters were normal. Right Ventricle: The right ventricular size is normal. No increase in right ventricular wall thickness. Right ventricular systolic function is normal. Left Atrium: Left atrial size was normal in size. Right Atrium: Right atrial size was normal in size. Pericardium: There is no evidence of pericardial effusion. Mitral Valve: The mitral valve is normal in structure. Trivial mitral valve regurgitation. Tricuspid  Valve: The tricuspid valve is normal in structure. Tricuspid valve regurgitation is trivial. Aortic Valve: The aortic valve was not well visualized. Aortic valve regurgitation is not visualized. Pulmonic Valve: The pulmonic valve was normal in structure. Pulmonic valve regurgitation is mild. Aorta: The aortic root and ascending aorta are structurally normal, with no evidence of dilitation. Venous: The inferior vena cava is normal in size with greater than 50% respiratory variability, suggesting right atrial pressure of 3 mmHg. IAS/Shunts: No atrial level shunt detected by color flow Doppler.  LEFT VENTRICLE PLAX 2D LVIDd:         3.59 cm   Diastology LVIDs:         2.52 cm   LV e' medial:    10.30 cm/s LV PW:         1.10 cm   LV E/e' medial:  7.0 LV IVS:        1.33 cm   LV e' lateral:   11.00 cm/s LVOT diam:     2.10 cm   LV E/e' lateral: 6.6 LV SV:         81 LV SV Index:   38 LVOT Area:     3.46 cm  RIGHT VENTRICLE             IVC RV S prime:     12.20 cm/s  IVC diam: 1.58 cm TAPSE (M-mode): 2.3 cm LEFT ATRIUM             Index        RIGHT ATRIUM           Index LA diam:        3.60 cm 1.67 cm/m   RA Area:     17.30 cm LA Vol (A2C):   42.7 ml 19.84 ml/m  RA Volume:   43.90 ml  20.39 ml/m LA Vol (A4C):   48.1 ml 22.34 ml/m LA Biplane Vol: 44.8 ml 20.81 ml/m  AORTIC VALVE LVOT Vmax:   102.00 cm/s LVOT Vmean:  66.000 cm/s LVOT VTI:    0.234 m  AORTA Ao Root diam: 2.70  cm Ao Asc diam:  3.40 cm MITRAL VALVE MV Area (PHT): 4.06 cm    SHUNTS MV Decel Time: 187 msec    Systemic VTI:  0.23 m MV E velocity: 72.30 cm/s  Systemic Diam: 2.10 cm MV A velocity: 77.60 cm/s MV E/A ratio:  0.93 Redell Cave MD Electronically signed by Redell Cave MD Signature Date/Time: 03/19/2024/1:06:10 PM    Final    CT Head Wo Contrast Result Date: 02/23/2024 CLINICAL DATA:  Seizure EXAM: CT HEAD WITHOUT CONTRAST TECHNIQUE: Contiguous axial images were obtained from the base of the skull through the vertex without  intravenous contrast. RADIATION DOSE REDUCTION: This exam was performed according to the departmental dose-optimization program which includes automated exposure control, adjustment of the mA and/or kV according to patient size and/or use of iterative reconstruction technique. COMPARISON:  None Available. FINDINGS: Brain: No acute intracranial abnormality. Specifically, no hemorrhage, hydrocephalus, mass lesion, acute infarction, or significant intracranial injury. Vascular: No hyperdense vessel or unexpected calcification. Skull: No acute calvarial abnormality. Sinuses/Orbits: No acute findings Other: None IMPRESSION: No acute intracranial abnormality. Electronically Signed   By: Franky Crease M.D.   On: 02/23/2024 14:59    Microbiology: No results found for this or any previous visit (from the past 240 hours).   Labs: CBC: Recent Labs  Lab 03/18/24 1503 03/19/24 0439 03/20/24 0642  WBC 9.6 9.4 6.4  NEUTROABS 7.0  --   --   HGB 14.6 13.2 13.6  HCT 42.2 38.4 38.7  MCV 92.1 93.2 92.1  PLT 293 257 244   Basic Metabolic Panel: Recent Labs  Lab 03/18/24 1503 03/19/24 0439 03/20/24 0642  NA 139 140 140  K 2.7* 3.5 3.5  CL 104 106 106  CO2 21* 26 26  GLUCOSE 152* 91 85  BUN 13 10 12   CREATININE 0.69 0.61 0.71  CALCIUM 8.3* 8.5* 8.6*  MG 1.9 2.2 2.2  PHOS 2.2* 3.3 4.0   Liver Function Tests: No results for input(s): AST, ALT, ALKPHOS, BILITOT, PROT, ALBUMIN in the last 168 hours. No results for input(s): LIPASE, AMYLASE in the last 168 hours. No results for input(s): AMMONIA in the last 168 hours. Cardiac Enzymes: Recent Labs  Lab 03/18/24 1503  CKTOTAL 76   BNP (last 3 results) No results for input(s): BNP in the last 8760 hours. CBG: Recent Labs  Lab 03/19/24 0743 03/19/24 1208 03/19/24 1719  GLUCAP 85 91 83    Time spent: 35 minutes  Signed:  Elvan Sor  [facility] 03/20/2024 11:01 AM

## 2024-03-20 NOTE — Progress Notes (Addendum)
 NEUROLOGY CONSULT FOLLOW UP NOTE   Date of service: March 20, 2024 Patient Name: Melody Schaefer MRN:  [id number] DOB:  02-17-1966  Interval Hx/subjective  Seen and examined. 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  Started on aspirin  and Plavix  yesterday.  Vitals   Vitals:   03/19/24 1718 03/19/24 2024 03/20/24 0353 03/20/24 0752  BP: 139/83 (!) 140/70 118/76 133/68  Pulse: 64 65 (!) 56 (!) 59  Resp:  18 18 16   Temp: 98.4 F (36.9 C) 98 F (36.7 C) 97.8 F (36.6 C) 98.6 F (37 C)  TempSrc:      SpO2: 98% 100% 97% 95%  Weight:      Height:         Body mass index is 29.29 kg/m.  Physical Exam   General: Awake alert in no distress HEENT: Normal [place] rheumatic Lungs clear Cardiovascular: Regular rhythm Neurological Mental Status: AA&Ox3 Speech and Language: speech is not dysarthric.  Naming, repetition, fluency, and comprehension intact. Cranial Nerves: PERRL. EOMI, visual fields full, no facial asymmetry, facial sensation intact, hearing intact, tongue/uvula/soft palate midline, normal sternocleidomastoid and trapezius muscle strength. No evidence of tongue atrophy or fibrillations Motor: Tone: is normal and bulk is normal Sensation- Intact to light touch bilaterally Coordination: FTN intact bilaterally, no ataxia in BLE. Gait- deferred  Medications  Current Facility-Administered Medications:    acetaminophen  (TYLENOL ) tablet 650 mg, 650 mg, Oral, Q6H PRN **OR** acetaminophen  (TYLENOL ) suppository 650 mg, 650 mg, Rectal, Q6H PRN, Von Bellis, MD   aspirin  EC tablet 81 mg, 81 mg, Oral, Daily, Sayyid Harewood, MD, 81 mg at 03/20/24 0830   clopidogrel  (PLAVIX ) tablet 75 mg, 75 mg, Oral, Daily, Jaedin Regina, MD, 75 mg at 03/20/24 0830   cyanocobalamin  (VITAMIN B12) injection 1,000 mcg, 1,000 mcg, Intramuscular, Q1200 **FOLLOWED BY** [START ON 03/27/2024] cyanocobalamin  (VITAMIN B12) tablet 1,000 mcg, 1,000 mcg,  Oral, Daily, Von Bellis, MD   enoxaparin  (LOVENOX ) injection 40 mg, 40 mg, Subcutaneous, Q24H, Von Bellis, MD, 40 mg at 03/19/24 2237   hydrALAZINE  (APRESOLINE ) injection 10 mg, 10 mg, Intravenous, Q6H PRN, Von Bellis, MD   insulin  aspart (novoLOG ) injection 0-9 Units, 0-9 Units, Subcutaneous, TID WC, Von Bellis, MD   LORazepam  (ATIVAN ) injection 2 mg, 2 mg, Intravenous, Q5 Min x 3 PRN, Von Bellis, MD   metoprolol  tartrate (LOPRESSOR ) tablet 12.5 mg, 12.5 mg, Oral, BID, Von Bellis, MD, 12.5 mg at 03/20/24 0830   ondansetron  (ZOFRAN ) tablet 4 mg, 4 mg, Oral, Q6H PRN **OR** ondansetron  (ZOFRAN ) injection 4 mg, 4 mg, Intravenous, Q6H PRN, Von Bellis, MD   sodium chloride  flush (NS) 0.9 % injection 3 mL, 3 mL, Intravenous, Q12H, Von Bellis, MD, 3 mL at 03/19/24 9077   sodium chloride  flush (NS) 0.9 % injection 3 mL, 3 mL, Intravenous, Q12H, Von Bellis, MD, 3 mL at 03/19/24 2240   sodium chloride  flush (NS) 0.9 % injection 3 mL, 3 mL, Intravenous, PRN, Von Bellis, MD   Vitamin D  (Ergocalciferol ) (DRISDOL ) 1.25 MG (50000 UNIT) capsule 50,000 Units, 50,000 Units, Oral, Q7 days, Von Bellis, MD  Labs and Diagnostic Imaging   CBC:  Recent Labs  Lab 03/18/24 1503 03/19/24 0439 03/20/24 0642  WBC 9.6 9.4 6.4  NEUTROABS 7.0  --   --   HGB 14.6 13.2 13.6  HCT 42.2 38.4 38.7  MCV 92.1 93.2 92.1  PLT 293 257 244    Basic Metabolic Panel:  Lab Results  Component Value Date  NA 140 03/20/2024   K 3.5 03/20/2024   CO2 26 03/20/2024   GLUCOSE 85 03/20/2024   BUN 12 03/20/2024   CREATININE 0.71 03/20/2024   CALCIUM 8.6 (L) 03/20/2024   GFRNONAA >60 03/20/2024   Lipid Panel: No results found for: LDLCALC HgbA1c:  Lab Results  Component Value Date   HGBA1C 5.5 03/18/2024   Urine Drug Screen:     Component Value Date/Time   LABOPIA NONE DETECTED 03/19/2024 1730   COCAINSCRNUR NONE DETECTED 03/19/2024 1730   LABBENZ NONE DETECTED 03/19/2024 1730    AMPHETMU NONE DETECTED 03/19/2024 1730   THCU NONE DETECTED 03/19/2024 1730   LABBARB NONE DETECTED 03/19/2024 1730    CT Head without contrast(Personally reviewed): No acute abnormality  CT angio Head and Neck with contrast(Personally reviewed): Left vertebral artery multifocal severe narrowing and then occlusion near the C2 level with reconstitution in the V3 V4 junction concerning for vertebral artery dissection.  No intracranial propagation.  MRI Brain(Personally reviewed): No acute stroke. No specific seizure etiology identified.  No evidence of acute intracranial malady.  rEEG:    Normal  Urinalysis: Large leukocyte esterase, rare bacteria, greater than 50 white cells, 0-5 RBCs, 6-10 squamous epithelial  UDS is negative  Assessment   Melody Schaefer is a 58 y.o. adult  past medical history of hypertension, 1 episode of passing out in June with a second episode of syncope versus seizure.  No witnessed seizure activity but small laceration on the left forehead from the last fall.  She has no recollection of what happened and why she was on the ground.  Initially, the suspicion was high for seizure but her imaging has revealed a left vertebral artery dissection and she reports that the smell of something like a diesel engine was actually present and not an aura.  This makes me think that the vertebrobasilar hypoperfusion/insufficiency in the setting of dissection may have caused syncope and this is more likely syncope than seizure.  Furthermore, not much of any abnormality on the brain MRI and a normal EEG also make me favor a vascular etiology rather than an electrographic etiology of her presentation Also, her UA seems concerning for UTI which might of contributed to syncope.  Impression: Left vertebral artery dissection, syncope  Recommendations  At this point I do not think I need to subject her to seizure precautions given that the VBI/syncope from vertebral artery dissection is  the more likely culprit of her symptoms. She should still be careful not to drive for the next week or so to make sure that there is no recurrence of these episodes. She has been started on aspirin  81 and Plavix  75 which she should continue to take till she gets to see an outpatient neurologist and repeat imaging is done to ensure resolution of the dissection. Treatment of UTI No need for antiseizure medications at this time but if she has any other episode concerning for seizure, she may need to repeat EEG and consideration for antiseizure meds. Follow-up with outpatient neurology in about 8 weeks.  ______________________________________________________________________   Bonney Eligio Lav, MD [facility]   Addendum-discussed with Dr. Von, hospitalist over phone.

## 2024-03-21 LAB — URINE CULTURE

## 2024-03-22 LAB — GLUCOSE, CAPILLARY: Glucose-Capillary: 98 mg/dL (ref 70–99)

## 2024-08-03 ENCOUNTER — Observation Stay
Admission: EM | Admit: 2024-08-03 | Discharge: 2024-08-04 | Disposition: A | Discharge status: Home / Self Care | Attending: Family Medicine | Admitting: Family Medicine

## 2024-08-03 ENCOUNTER — Emergency Department

## 2024-08-03 ENCOUNTER — Other Ambulatory Visit: Payer: Self-pay

## 2024-08-03 DIAGNOSIS — Z79899 Other long term (current) drug therapy: Secondary | ICD-10-CM | POA: Insufficient documentation

## 2024-08-03 DIAGNOSIS — G40909 Epilepsy, unspecified, not intractable, without status epilepticus: Principal | ICD-10-CM | POA: Insufficient documentation

## 2024-08-03 DIAGNOSIS — Z7982 Long term (current) use of aspirin: Secondary | ICD-10-CM | POA: Insufficient documentation

## 2024-08-03 DIAGNOSIS — D72829 Elevated white blood cell count, unspecified: Secondary | ICD-10-CM | POA: Insufficient documentation

## 2024-08-03 DIAGNOSIS — Z87898 Personal history of other specified conditions: Secondary | ICD-10-CM

## 2024-08-03 DIAGNOSIS — E785 Hyperlipidemia, unspecified: Secondary | ICD-10-CM | POA: Insufficient documentation

## 2024-08-03 DIAGNOSIS — R569 Unspecified convulsions: Principal | ICD-10-CM

## 2024-08-03 DIAGNOSIS — I10 Essential (primary) hypertension: Secondary | ICD-10-CM | POA: Insufficient documentation

## 2024-08-03 DIAGNOSIS — G40409 Other generalized epilepsy and epileptic syndromes, not intractable, without status epilepticus: Secondary | ICD-10-CM

## 2024-08-03 DIAGNOSIS — N179 Acute kidney failure, unspecified: Secondary | ICD-10-CM | POA: Insufficient documentation

## 2024-08-03 DIAGNOSIS — F32A Depression, unspecified: Secondary | ICD-10-CM | POA: Insufficient documentation

## 2024-08-03 LAB — CBG MONITORING, ED: Glucose-Capillary: 149 mg/dL — ABNORMAL HIGH (ref 70–99)

## 2024-08-03 MED ORDER — SODIUM CHLORIDE 0.9 % IV SOLN: INTRAVENOUS | Status: DC

## 2024-08-03 MED ORDER — LORAZEPAM 2 MG/ML IJ SOLN
INTRAMUSCULAR | Status: AC
  Filled 2024-08-03: qty 1

## 2024-08-03 MED ORDER — LEVETIRACETAM (KEPPRA) 500 MG/5 ML ADULT IV PUSH: 1000.0000 mg | Freq: Once | INTRAVENOUS | Status: AC

## 2024-08-03 MED ORDER — SODIUM CHLORIDE 0.9 % IV BOLUS (SEPSIS)
1000.0000 mL | Freq: Once | INTRAVENOUS | Status: AC
  Administered 2024-08-04: 1000 mL via INTRAVENOUS

## 2024-08-03 MED ORDER — LEVETIRACETAM 500 MG/5ML IV SOLN
INTRAVENOUS | Status: AC
  Administered 2024-08-03: 1000 mg via INTRAVENOUS
  Filled 2024-08-03: qty 10

## 2024-08-03 MED ORDER — IOHEXOL 350 MG/ML SOLN
75.0000 mL | Freq: Once | INTRAVENOUS | Status: AC | PRN
  Administered 2024-08-03: 75 mL via INTRAVENOUS

## 2024-08-03 NOTE — ED Provider Notes (Incomplete)
 [facility] Provider Note    Event Date/Time   First MD Initiated Contact with Patient 08/03/24 2302     (approximate)   History   Seizures   HPI  Melody Schaefer is a 58 y.o. 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  Family witnessed a generalized tonic-clonic seizure at home that lasted several minutes.  Postictal with EMS.  Recently admitted to the hospital in July after syncopal events in June and July and had negative EEG at that time.  Was recently found to have a left vertebral artery dissection on CT imaging.  Family denies any prior history of seizure.  She has been in her normal state of health.  They report that she has no medication changes, drug or alcohol use, recent fevers, cough, vomiting or diarrhea.   History provided by patient's sons at bedside.    Past Medical History:  Diagnosis Date  . Hypertension     Past Surgical History:  Procedure Laterality Date  . TUBAL LIGATION      MEDICATIONS:  Prior to Admission medications   Medication Sig Start Date End Date Taking? Authorizing Provider  acetaminophen  (TYLENOL ) 325 MG tablet Take 2 tablets (650 mg total) by mouth every 6 (six) hours as needed for mild pain (pain score 1-3), fever, headache or moderate pain (pain score 4-6) (or Fever >/= 101). 03/20/24   Von Bellis, MD  aspirin  EC 81 MG tablet Take 1 tablet (81 mg total) by mouth daily. Swallow whole. 03/21/24   Von Bellis, MD  atorvastatin  (LIPITOR) 20 MG tablet Take 20 mg by mouth daily.    [provider]  buPROPion  (WELLBUTRIN ) 75 MG tablet Take 75 mg by mouth 2 (two) times daily.    [provider]  losartan  (COZAAR ) 25 MG tablet Take 1 tablet (25 mg total) by mouth daily. 03/21/24 03/21/25  Von Bellis, MD    Physical Exam   Triage Vital Signs: ED Triage Vitals  Encounter Vitals Group     BP  08/03/24 2239 (!) 171/100     Girls Systolic BP Percentile --      Girls Diastolic BP Percentile --      Boys Systolic BP Percentile --      Boys Diastolic BP Percentile --      Pulse Rate 08/03/24 2239 (!) 114     Resp 08/03/24 2239 20     Temp 08/03/24 2239 98.2 F (36.8 C)     Temp Source 08/03/24 2239 Oral     SpO2 08/03/24 2239 95 %     Weight 08/03/24 2237 203 lb (92.1 kg)     Height 08/03/24 2237 5' 11 (1.803 m)     Head Circumference --      Peak Flow --      Pain Score 08/03/24 2237 0     Pain Loc --      Pain Education --      Exclude from Growth Chart --     Most recent vital signs: Vitals:   08/03/24 2239 08/03/24 2300  BP: (!) 171/100 (!) 169/97  Pulse: (!) 114 (!) 114  Resp: 20   Temp: 98.2 F (36.8 C)   SpO2: 95% 93%    CONSTITUTIONAL: Patient actively seizing when I enter the room.  Seizure generalized tonic-clonic.  Lasted for approximately 2 minutes.  Now postictal. HEAD: Normocephalic, atraumatic EYES: Conjunctivae clear, pupils appear  equal, sclera nonicteric ENT: normal nose; moist mucous membranes NECK: Supple, normal ROM CARD: Regular and tachycardic; S1 and S2 appreciated RESP: Normal chest excursion without splinting or tachypnea; breath sounds clear and equal bilaterally; no wheezes, no rhonchi, no rales, no hypoxia or respiratory distress, speaking full sentences ABD/GI: Non-distended; soft EXT: Warm and well-perfused, no deformity noted, no edema SKIN: Normal color for age and race; warm; no rash on exposed skin NEURO: Postictal   ED Results / Procedures / Treatments   LABS: (all labs ordered are listed, but only abnormal results are displayed) Labs Reviewed  CBG MONITORING, ED - Abnormal; Notable for the following components:      Result Value   Glucose-Capillary 149 (*)    All other components within normal limits  CBC WITH DIFFERENTIAL/PLATELET  COMPREHENSIVE METABOLIC PANEL WITH GFR  ETHANOL  URINALYSIS, ROUTINE W REFLEX  MICROSCOPIC  URINE DRUG SCREEN  MAGNESIUM     EKG:  EKG Interpretation Date/Time:  Tuesday August 03 2024 23:33:53 EST Ventricular Rate:  118 PR Interval:  170 QRS Duration:  91 QT Interval:  317 QTC Calculation: 445 R Axis:   11  Text Interpretation: Sinus tachycardia Probable left atrial enlargement Confirmed by Neomi Neptune [phone]) on 08/03/2024 11:37:09 PM         RADIOLOGY: My personal review and interpretation of imaging:  ***  I have personally reviewed all radiology reports.   No results found.   PROCEDURES:  Critical Care performed: Yes, see critical care procedure note(s)   CRITICAL CARE Performed by: Neptune Neomi   Total critical care time: 30 minutes  Critical care time was exclusive of separately billable procedures and treating other patients.  Critical care was necessary to treat or prevent imminent or life-threatening deterioration.  Critical care was time spent personally by me on the following activities: development of treatment plan with patient and/or surrogate as well as nursing, discussions with consultants, evaluation of patient's response to treatment, examination of patient, obtaining history from patient or surrogate, ordering and performing treatments and interventions, ordering and review of laboratory studies, ordering and review of radiographic studies, pulse oximetry and re-evaluation of patient's condition.   SABRA1-3 Lead EKG Interpretation  Performed by: Corry Ihnen, Neptune SAILOR, DO Authorized by: Taelyn Nemes, Neptune SAILOR, DO     Interpretation: abnormal     ECG rate:  114   ECG rate assessment: tachycardic     Rhythm: sinus tachycardia     Ectopy: none     Conduction: normal       IMPRESSION / MDM / ASSESSMENT AND PLAN / ED COURSE  I reviewed the triage vital signs and the nursing notes.    Patient here for witnessed seizure activity at home and then again here in the emergency department.  She is also recently been admitted to the  hospital for 2 syncopal events and noted to have a negative EEG at that time.  The patient is on the cardiac monitor to evaluate for evidence of arrhythmia and/or significant heart rate changes.   DIFFERENTIAL DIAGNOSIS (includes but not limited to):   Seizure, CVA, intracranial hemorrhage, dissection, electrolyte derangement, meningitis, encephalitis, status epilepticus   Patient's presentation is most consistent with acute presentation with potential threat to life or bodily function.   PLAN: Will obtain labs, urine, emergent CTA head and neck.  Patient given 2 mg of Ativan  and seizure activity stopped but patient still postictal, tachycardic and hypertensive.  Will give 1 g of Keppra .  Will place patient on  seizure precautions.  She will need medical admission.   MEDICATIONS GIVEN IN ED: Medications  LORazepam  (ATIVAN ) 2 MG/ML injection (has no administration in time range)  levETIRAcetam  (KEPPRA ) undiluted injection 1,000 mg (has no administration in time range)  sodium chloride  0.9 % bolus 1,000 mL (has no administration in time range)  levETIRAcetam  (KEPPRA ) 500 MG/5ML injection (has no administration in time range)  0.9 %  sodium chloride  infusion (has no administration in time range)  iohexol  (OMNIPAQUE ) 350 MG/ML injection 75 mL (75 mLs Intravenous Contrast Given 08/03/24 2343)     ED COURSE:  ***   CONSULTS:  ***   OUTSIDE RECORDS REVIEWED: Reviewed recent hospital notes.       FINAL CLINICAL IMPRESSION(S) / ED DIAGNOSES   Final diagnoses:  Seizure (HCC)     Rx / DC Orders   ED Discharge Orders     None        Note:  This document was prepared using Dragon voice recognition software and may include unintentional dictation errors.

## 2024-08-03 NOTE — ED Provider Notes (Signed)
 [facility] Provider Note    Event Date/Time   First MD Initiated Contact with Patient 08/03/24 2302     (approximate)   History   Seizures   HPI  Melody Schaefer is a 58 y.o. 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  Family witnessed a generalized tonic-clonic seizure at home that lasted several minutes.  Postictal with EMS.  Family reports she had 1 similar episode witnessed by coworkers where she lost consciousness and then had seizure-like activity for 2 to 3 minutes in June.  She was seen in the ED at that time and given outpatient follow-up.  She has not seen neurology as an outpatient.  Recently admitted to the hospital in July after she had an episode of loss of consciousness with no seizure activity and had negative EEG and MRI of the brain with and without contrast at that time but was found to have what appeared to be a left vertebral artery dissection on CT imaging during that admission and was started on Plavix .  Family denies any prior history of seizure.  She has been in her normal state of health.  They report that she has no medication changes, drug or alcohol use, recent fevers, cough, vomiting or diarrhea.   History provided by patient's sons at bedside.    Past Medical History:  Diagnosis Date   Hypertension     Past Surgical History:  Procedure Laterality Date   TUBAL LIGATION      MEDICATIONS:  Prior to Admission medications   Medication Sig Start Date End Date Taking? Authorizing Provider  acetaminophen  (TYLENOL ) 325 MG tablet Take 2 tablets (650 mg total) by mouth every 6 (six) hours as needed for mild pain (pain score 1-3), fever, headache or moderate pain (pain score 4-6) (or Fever >/= 101). 03/20/24   Von Bellis, MD  aspirin  EC 81 MG tablet Take 1 tablet (81 mg total) by mouth daily. Swallow whole. 03/21/24   Von Bellis, MD  atorvastatin  (LIPITOR) 20 MG tablet Take 20 mg by mouth daily.    [provider]  buPROPion  (WELLBUTRIN ) 75 MG tablet Take 75 mg by mouth 2 (two) times daily.    [provider]  losartan  (COZAAR ) 25 MG tablet Take 1 tablet (25 mg total) by mouth daily. 03/21/24 03/21/25  Von Bellis, MD    Physical Exam   Triage Vital Signs: ED Triage Vitals  Encounter Vitals Group     BP 08/03/24 2239 (!) 171/100     Girls Systolic BP Percentile --      Girls Diastolic BP Percentile --      Boys Systolic BP Percentile --      Boys Diastolic BP Percentile --      Pulse Rate 08/03/24 2239 (!) 114     Resp 08/03/24 2239 20     Temp 08/03/24 2239 98.2 F (36.8 C)     Temp Source 08/03/24 2239 Oral     SpO2 08/03/24 2239 95 %     Weight 08/03/24 2237 203 lb (92.1 kg)     Height 08/03/24 2237 5' 11 (1.803 m)     Head Circumference --      Peak Flow --      Pain Score 08/03/24 2237 0     Pain Loc --      Pain Education --      Exclude from Growth Chart --  Most recent vital signs: Vitals:   08/04/24 0130 08/04/24 0200  BP: (!) 169/97 (!) 147/85  Pulse: 97 95  Resp: (!) 23 20  Temp:    SpO2: 92% 94%    CONSTITUTIONAL: Patient actively seizing when I enter the room.  Seizure generalized tonic-clonic.  Lasted for approximately 2 minutes.  Now postictal. HEAD: Normocephalic, atraumatic EYES: Conjunctivae clear, pupils appear equal, sclera nonicteric ENT: normal nose; moist mucous membranes NECK: Supple, normal ROM CARD: Regular and tachycardic; S1 and S2 appreciated RESP: Normal chest excursion without splinting or tachypnea; breath sounds clear and equal bilaterally; no wheezes, no rhonchi, no rales, no hypoxia or respiratory distress, speaking full sentences ABD/GI: Non-distended; soft EXT: Warm and well-perfused, no deformity noted, no edema SKIN: Normal color for age and race; warm; no rash on exposed skin NEURO: Postictal   ED Results / Procedures  / Treatments   LABS: (all labs ordered are listed, but only abnormal results are displayed) Labs Reviewed  CBC WITH DIFFERENTIAL/PLATELET - Abnormal; Notable for the following components:      Result Value   WBC 17.5 (*)    Neutro Abs 14.5 (*)    Monocytes Absolute 1.2 (*)    Abs Immature Granulocytes 0.13 (*)    All other components within normal limits  COMPREHENSIVE METABOLIC PANEL WITH GFR - Abnormal; Notable for the following components:   Glucose, Bld 168 (*)    Creatinine, Ser 1.04 (*)    All other components within normal limits  URINALYSIS, ROUTINE W REFLEX MICROSCOPIC - Abnormal; Notable for the following components:   Color, Urine YELLOW (*)    APPearance CLEAR (*)    Glucose, UA 50 (*)    All other components within normal limits  CBG MONITORING, ED - Abnormal; Notable for the following components:   Glucose-Capillary 149 (*)    All other components within normal limits  ETHANOL  URINE DRUG SCREEN  MAGNESIUM     EKG:  EKG Interpretation Date/Time:  Tuesday August 03 2024 23:33:53 EST Ventricular Rate:  118 PR Interval:  170 QRS Duration:  91 QT Interval:  317 QTC Calculation: 445 R Axis:   11  Text Interpretation: Sinus tachycardia Probable left atrial enlargement Confirmed by Neomi Neptune [id number]) on 08/03/2024 11:37:09 PM         RADIOLOGY: My personal review and interpretation of imaging: CTA head and neck showed no acute abnormality. I have personally reviewed all radiology reports.   DG Chest Portable 1 View Result Date: 08/04/2024 EXAM: 1 VIEW(S) XRAY OF THE CHEST 08/04/2024 12:57:00 AM COMPARISON: None available. CLINICAL HISTORY: started vomiting with NRB on, eval for aspiration FINDINGS: LUNGS AND PLEURA: Left lung base atelectasis. No pleural effusion. No pneumothorax. HEART AND MEDIASTINUM: No acute abnormality of the cardiac and mediastinal silhouettes. BONES AND SOFT TISSUES: No acute osseous abnormality. IMPRESSION: 1. Left lung base  atelectasis. Electronically signed by: Oneil Devonshire MD 08/04/2024 01:02 AM EST RP Workstation: [id number]   CT ANGIO HEAD NECK W WO CM Result Date: 08/04/2024 CLINICAL DATA:  Initial evaluation for acute seizure. EXAM: CT ANGIOGRAPHY HEAD AND NECK WITH AND WITHOUT CONTRAST TECHNIQUE: Multidetector CT imaging of the head and neck was performed using the standard protocol during bolus administration of intravenous contrast. Multiplanar CT image reconstructions and MIPs were obtained to evaluate the vascular anatomy. Carotid stenosis measurements (when applicable) are obtained utilizing NASCET criteria, using the distal internal carotid diameter as the denominator. RADIATION DOSE REDUCTION: This exam was performed according to the departmental  dose-optimization program which includes automated exposure control, adjustment of the mA and/or kV according to patient size and/or use of iterative reconstruction technique. CONTRAST:  75mL OMNIPAQUE  IOHEXOL  350 MG/ML SOLN COMPARISON:  Prior CT from 03/19/2024. FINDINGS: CT HEAD FINDINGS Brain: Mild age-related cerebral atrophy. Patchy hypodensity involving the supratentorial cerebral white matter, consistent with chronic small vessel ischemic disease. No acute intracranial hemorrhage. No acute large vessel territory infarct. No mass lesion or midline shift. No hydrocephalus or extra-axial fluid collection. Vascular: No abnormal hyperdense vessel. Skull: Scalp soft tissues demonstrate no acute finding. Calvarium intact. Sinuses/Orbits: Globes orbital soft tissues within normal limits. Paranasal sinuses and mastoid air cells are largely clear. Other: None. Review of the MIP images confirms the above findings CTA NECK FINDINGS Aortic arch: Visualized aortic arch within normal limits for caliber with standard 3 vessel morphology. Mild aortic atherosclerosis. No stenosis about the origin of the great vessels. Right carotid system: Right common and internal carotid arteries are  patent without stenosis or dissection. Left carotid system: Left common and internal carotid arteries are patent without stenosis or dissection. Mild atheromatous change about the left carotid bulb without stenosis. Vertebral arteries: Both vertebral arteries arise from the subclavian arteries. Right vertebral artery strongly dominant and patent without stenosis or dissection. 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 Hypoplastic left vertebral artery patent to approximately the level of C2, but no significant flow distally into the skull base. Appearance is stable from prior. Skeleton: No worrisome osseous lesions. Other neck: No other acute finding. 1.3 cm left thyroid nodule noted, of doubtful significance given size and patient age, no follow-up imaging recommended (ref: J Am Coll Radiol. 2015 Feb;12(2): 143-50). Upper chest: No other acute finding. Review of the MIP images confirms the above findings CTA HEAD FINDINGS Anterior circulation: Examination mildly degraded by motion artifact. Atheromatous change about the carotid siphons without hemodynamically significant stenosis. A1 segments patent bilaterally. Normal anterior communicating artery complex. Anterior cerebral arteries patent without visible stenosis. No M1 stenosis or occlusion. Right MCA bifurcates early. No proximal MCA branch occlusion or high-grade stenosis. Distal MCA branches perfused and symmetric. Posterior circulation: Dominant right V4 segment widely patent. Right PICA origin not well seen. Left vertebral artery markedly hypoplastic. Retrograde filling of the left V4 segment with perfusion of the left PICA. Basilar patent without stenosis. Superior cerebral arteries patent bilaterally. Right PCA supplied via the basilar. Left PCA supplied via a hypoplastic left P1 segment and prominent left posterior communicating artery. Both PCAs patent to their distal aspects without  stenosis. Venous sinuses: Patent allowing for timing the contrast bolus. Anatomic variants: As above.  No aneurysm. Review of the MIP images confirms the above findings IMPRESSION: CT HEAD: 1. No acute intracranial abnormality. 2. Mild age-related cerebral atrophy with chronic small vessel ischemic disease. CTA HEAD AND NECK: 1. Negative CTA for large vessel occlusion or other emergent finding. 2. Markedly diminutive and hypoplastic left vertebral artery, stable from prior. 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 Left vertebral artery contributes little to the posterior circulation, with retrograde filling of the left V4 segment and perfusion of the left PICA. Strongly dominant right vertebral artery widely patent. 3. Mild atheromatous change about the carotid bifurcations and carotid siphons without hemodynamically significant stenosis. 4.  Aortic Atherosclerosis (ICD10-I70.0). Aortic Atherosclerosis (ICD10-I70.0). Electronically Signed   By: Morene Hoard M.D.   On:  08/04/2024 00:40     PROCEDURES:  Critical Care performed: Yes, see critical care procedure note(s)   CRITICAL CARE Performed by: Josette Vinal Rosengrant   Total critical care time: 30 minutes  Critical care time was exclusive of separately billable procedures and treating other patients.  Critical care was necessary to treat or prevent imminent or life-threatening deterioration.  Critical care was time spent personally by me on the following activities: development of treatment plan with patient and/or surrogate as well as nursing, discussions with consultants, evaluation of patient's response to treatment, examination of patient, obtaining history from patient or surrogate, ordering and performing treatments and interventions, ordering and review of laboratory studies, ordering and review of radiographic studies, pulse oximetry and  re-evaluation of patient's condition.   SABRA1-3 Lead EKG Interpretation  Performed by: Branden Shallenberger, Josette SAILOR, DO Authorized by: Sonda Coppens, Josette SAILOR, DO     Interpretation: abnormal     ECG rate:  114   ECG rate assessment: tachycardic     Rhythm: sinus tachycardia     Ectopy: none     Conduction: normal       IMPRESSION / MDM / ASSESSMENT AND PLAN / ED COURSE  I reviewed the triage vital signs and the nursing notes.    Patient here for witnessed seizure activity at home and then again here in the emergency department.  She is also recently been admitted to the hospital for 2 syncopal events and noted to have a negative EEG at that time.  The patient is on the cardiac monitor to evaluate for evidence of arrhythmia and/or significant heart rate changes.   DIFFERENTIAL DIAGNOSIS (includes but not limited to):   Seizure, CVA, intracranial hemorrhage, dissection, electrolyte derangement, meningitis, encephalitis, status epilepticus   Patient's presentation is most consistent with acute presentation with potential threat to life or bodily function.   PLAN: Will obtain labs, urine, emergent CTA head and neck.  Patient given 2 mg of Ativan  and seizure activity stopped but patient still postictal, tachycardic and hypertensive.  Will give 1 g of Keppra .  Will place patient on seizure precautions.  She will need medical admission.   MEDICATIONS GIVEN IN ED: Medications  0.9 %  sodium chloride  infusion ( Intravenous New Bag/Given 08/04/24 0029)  levETIRAcetam  (KEPPRA ) undiluted injection 500 mg (has no administration in time range)  atorvastatin  (LIPITOR) tablet 20 mg (has no administration in time range)  losartan  (COZAAR ) tablet 25 mg (has no administration in time range)  buPROPion  (WELLBUTRIN  XL) 24 hr tablet 150 mg (has no administration in time range)  aspirin  EC tablet 81 mg (has no administration in time range)  Oral care mouth rinse (has no administration in time range)  Oral care mouth  rinse (has no administration in time range)  0.9 %  sodium chloride  infusion (75 mL/hr Intravenous Not Given 08/04/24 0354)  enoxaparin  (LOVENOX ) injection 40 mg (has no administration in time range)  LORazepam  (ATIVAN ) injection 2 mg (has no administration in time range)  acetaminophen  (TYLENOL ) tablet 650 mg (has no administration in time range)    Or  acetaminophen  (TYLENOL ) suppository 650 mg (has no administration in time range)  ondansetron  (ZOFRAN ) tablet 4 mg (has no administration in time range)    Or  ondansetron  (ZOFRAN ) injection 4 mg (has no administration in time range)  LORazepam  (ATIVAN ) 2 MG/ML injection (  Given 08/03/24 2303)  levETIRAcetam  (KEPPRA ) undiluted injection 1,000 mg (1,000 mg Intravenous Given 08/03/24 2335)  sodium chloride  0.9 % bolus 1,000 mL (0  mLs Intravenous Stopped 08/04/24 0138)  iohexol  (OMNIPAQUE ) 350 MG/ML injection 75 mL (75 mLs Intravenous Contrast Given 08/03/24 2343)  ondansetron  (ZOFRAN ) injection 4 mg (4 mg Intravenous Given 08/04/24 0026)  ondansetron  (ZOFRAN ) injection 4 mg (4 mg Intravenous Given 08/04/24 0049)  metoCLOPramide  (REGLAN ) injection 10 mg (10 mg Intravenous Given 08/04/24 0138)     ED COURSE: Patient's labs show leukocytosis of 17,000.  Likely reactive in nature.  She is afebrile here and is now awake and communicative and denies any recent infectious symptoms.  No headache, meningismus on exam.  Neuro intact at this time.  She is drowsy but oriented x 3.  Patient has had several episodes of vomiting.  Chest x-ray was obtained and reviewed/interpreted by myself and the radiologist and shows no aspiration.  We have given her antiemetics.  She has a nontender abdomen and she denies any abdominal pain.  Urine does not appear infected today.  No further seizure activity noted after Ativan  and Keppra .  I do feel she needs admission to the hospital for observation given 2 seizures tonight and patient is still drowsy from being  postictal and getting Ativan .  Family comfortable with this plan but would like transfer to [facility] if possible.   CONSULTS:  1:47 AM  Spoke with Dr. Aram with neurology at [facility] who declines to accept patient at this time.  She states that patient could be discharged home on Keppra  750 mg twice daily given it does not sound that she is in status epilepticus given she is awake, alert, oriented, following commands and answering questions appropriately.  I agree that patient is not in status or having subclinical seizures and I do feel her presentation today warrants at least observation to ensure that she continues to return to baseline and does not have any further seizure-like activity and family would prefer this as well.  Will reach out to our hospitalist.    Consulted and discussed patient's case with hospitalist, Dr. Cleatus.  I have recommended admission and consulting physician agrees and will place admission orders.  Patient (and family if present) agree with this plan.   I reviewed all nursing notes, vitals, pertinent previous records.  All labs, EKGs, imaging ordered have been independently reviewed and interpreted by myself.    OUTSIDE RECORDS REVIEWED: Reviewed recent hospital notes.       FINAL CLINICAL IMPRESSION(S) / ED DIAGNOSES   Final diagnoses:  Seizure (HCC)     Rx / DC Orders   ED Discharge Orders     None        Note:  This document was prepared using Dragon voice recognition software and may include unintentional dictation errors.   Elba Dendinger, Josette SAILOR, DO 08/04/24 [phone]

## 2024-08-03 NOTE — ED Triage Notes (Signed)
 BIBEMS from home. Pt's family called because pt experienced seizure-like activity. Per family pt had jerking of the body and foaming of the mouth that lasted about 30 mins. After the seizure-like episode pt was altered for about 20 mins. Pt states they have had 2 similar seizure-like episodes in the last 5 months, but has not been referred to a neurologist. Hx of HTN.   EMS VS: 120 HR, 204/109 BP, 95% on RA, 98 F, 28 RR, 32 CO2, 107 CBG

## 2024-08-04 ENCOUNTER — Observation Stay

## 2024-08-04 ENCOUNTER — Emergency Department

## 2024-08-04 ENCOUNTER — Inpatient Hospital Stay

## 2024-08-04 DIAGNOSIS — N179 Acute kidney failure, unspecified: Secondary | ICD-10-CM

## 2024-08-04 DIAGNOSIS — T43295A Adverse effect of other antidepressants, initial encounter: Secondary | ICD-10-CM

## 2024-08-04 DIAGNOSIS — G40409 Other generalized epilepsy and epileptic syndromes, not intractable, without status epilepticus: Secondary | ICD-10-CM

## 2024-08-04 DIAGNOSIS — G40909 Epilepsy, unspecified, not intractable, without status epilepticus: Principal | ICD-10-CM

## 2024-08-04 DIAGNOSIS — Z87898 Personal history of other specified conditions: Secondary | ICD-10-CM

## 2024-08-04 DIAGNOSIS — E785 Hyperlipidemia, unspecified: Secondary | ICD-10-CM

## 2024-08-04 DIAGNOSIS — D72829 Elevated white blood cell count, unspecified: Secondary | ICD-10-CM

## 2024-08-04 DIAGNOSIS — I10 Essential (primary) hypertension: Secondary | ICD-10-CM | POA: Insufficient documentation

## 2024-08-04 DIAGNOSIS — I159 Secondary hypertension, unspecified: Secondary | ICD-10-CM

## 2024-08-04 DIAGNOSIS — F32A Depression, unspecified: Secondary | ICD-10-CM

## 2024-08-04 LAB — COMPREHENSIVE METABOLIC PANEL WITH GFR
ALT: 17 U/L (ref 0–44)
ALT: 15 U/L (ref 0–44)
AST: 20 U/L (ref 15–41)
AST: 24 U/L (ref 15–41)
Albumin: 4.3 g/dL (ref 3.5–5.0)
Albumin: 4 g/dL (ref 3.5–5.0)
Alkaline Phosphatase: 103 U/L (ref 38–126)
Alkaline Phosphatase: 100 U/L (ref 38–126)
Anion gap: 11 (ref 5–15)
Anion gap: 9 (ref 5–15)
BUN: 17 mg/dL (ref 6–20)
BUN: 11 mg/dL (ref 6–20)
CO2: 24 mmol/L (ref 22–32)
CO2: 24 mmol/L (ref 22–32)
Calcium: 9 mg/dL (ref 8.9–10.3)
Calcium: 8.5 mg/dL — ABNORMAL LOW (ref 8.9–10.3)
Chloride: 104 mmol/L (ref 98–111)
Chloride: 107 mmol/L (ref 98–111)
Creatinine, Ser: 1.04 mg/dL — ABNORMAL HIGH (ref 0.44–1.00)
Creatinine, Ser: 0.81 mg/dL (ref 0.44–1.00)
GFR, Estimated: >60 mL/min (ref >60)
GFR, Estimated: >60 mL/min (ref >60)
Glucose, Bld: 168 mg/dL — ABNORMAL HIGH (ref 70–99)
Glucose, Bld: 85 mg/dL (ref 70–99)
Potassium: 3.8 mmol/L (ref 3.5–5.1)
Potassium: 3.6 mmol/L (ref 3.5–5.1)
Sodium: 139 mmol/L (ref 135–145)
Sodium: 140 mmol/L (ref 135–145)
Total Bilirubin: 0.2 mg/dL (ref 0.0–1.2)
Total Bilirubin: 0.3 mg/dL (ref 0.0–1.2)
Total Protein: 6.5 g/dL (ref 6.5–8.1)
Total Protein: 6.3 g/dL — ABNORMAL LOW (ref 6.5–8.1)

## 2024-08-04 LAB — MAGNESIUM: Magnesium: 2.1 mg/dL (ref 1.7–2.4)

## 2024-08-04 LAB — URINE DRUG SCREEN
Amphetamines: NEGATIVE
Barbiturates: NEGATIVE
Benzodiazepines: NEGATIVE
Cocaine: NEGATIVE
Fentanyl: NEGATIVE
Methadone Scn, Ur: NEGATIVE
Opiates: NEGATIVE
Tetrahydrocannabinol: NEGATIVE

## 2024-08-04 LAB — URINALYSIS, ROUTINE W REFLEX MICROSCOPIC
Bilirubin Urine: NEGATIVE
Glucose, UA: 50 mg/dL — AB
Hgb urine dipstick: NEGATIVE
Ketones, ur: NEGATIVE mg/dL
Leukocytes,Ua: NEGATIVE
Nitrite: NEGATIVE
Protein, ur: NEGATIVE mg/dL
Specific Gravity, Urine: 1.024 (ref 1.005–1.030)
pH: 5 (ref 5.0–8.0)

## 2024-08-04 LAB — CBC WITH DIFFERENTIAL/PLATELET
Abs Immature Granulocytes: 0.13 K/uL — ABNORMAL HIGH (ref 0.00–0.07)
Abs Immature Granulocytes: 0.03 K/uL (ref 0.00–0.07)
Basophils Absolute: 0.1 K/uL (ref 0.0–0.1)
Basophils Absolute: 0 K/uL (ref 0.0–0.1)
Basophils Relative: 0 %
Basophils Relative: 0 %
Eosinophils Absolute: 0.1 K/uL (ref 0.0–0.5)
Eosinophils Absolute: 0 K/uL (ref 0.0–0.5)
Eosinophils Relative: 0 %
Eosinophils Relative: 0 %
HCT: 39.3 % (ref 36.0–46.0)
HCT: 37.1 % (ref 36.0–46.0)
Hemoglobin: 13.3 g/dL (ref 12.0–15.0)
Hemoglobin: 12.3 g/dL (ref 12.0–15.0)
Immature Granulocytes: 1 %
Immature Granulocytes: 0 %
Lymphocytes Relative: 9 %
Lymphocytes Relative: 12 %
Lymphs Abs: 1.5 K/uL (ref 0.7–4.0)
Lymphs Abs: 1.6 K/uL (ref 0.7–4.0)
MCH: 31.3 pg (ref 26.0–34.0)
MCH: 30.6 pg (ref 26.0–34.0)
MCHC: 33.8 g/dL (ref 30.0–36.0)
MCHC: 33.2 g/dL (ref 30.0–36.0)
MCV: 92.5 fL (ref 80.0–100.0)
MCV: 92.3 fL (ref 80.0–100.0)
Monocytes Absolute: 1.2 K/uL — ABNORMAL HIGH (ref 0.1–1.0)
Monocytes Absolute: 1 K/uL (ref 0.1–1.0)
Monocytes Relative: 7 %
Monocytes Relative: 8 %
Neutro Abs: 14.5 K/uL — ABNORMAL HIGH (ref 1.7–7.7)
Neutro Abs: 10.5 K/uL — ABNORMAL HIGH (ref 1.7–7.7)
Neutrophils Relative %: 83 %
Neutrophils Relative %: 80 %
Platelets: 289 K/uL (ref 150–400)
Platelets: 262 K/uL (ref 150–400)
RBC: 4.25 MIL/uL (ref 3.87–5.11)
RBC: 4.02 MIL/uL (ref 3.87–5.11)
RDW: 13.1 % (ref 11.5–15.5)
RDW: 13.1 % (ref 11.5–15.5)
WBC: 17.5 K/uL — ABNORMAL HIGH (ref 4.0–10.5)
WBC: 13.1 K/uL — ABNORMAL HIGH (ref 4.0–10.5)
nRBC: 0 % (ref 0.0–0.2)
nRBC: 0 % (ref 0.0–0.2)

## 2024-08-04 LAB — ETHANOL: Alcohol, Ethyl (B): <15 mg/dL (ref <15)

## 2024-08-04 MED ORDER — LOSARTAN POTASSIUM 50 MG PO TABS
25.0000 mg | ORAL_TABLET | Freq: Every day | ORAL | Status: DC
  Administered 2024-08-04: 25 mg via ORAL
  Filled 2024-08-04: qty 1

## 2024-08-04 MED ORDER — LEVETIRACETAM (KEPPRA) 500 MG/5 ML ADULT IV PUSH
500.0000 mg | Freq: Two times a day (BID) | INTRAVENOUS | Status: DC
  Administered 2024-08-04: 500 mg via INTRAVENOUS
  Filled 2024-08-04: qty 5

## 2024-08-04 MED ORDER — ACETAMINOPHEN 325 MG PO TABS: 650.0000 mg | ORAL_TABLET | ORAL | Status: DC | PRN

## 2024-08-04 MED ORDER — ONDANSETRON HCL 4 MG/2ML IJ SOLN
4.0000 mg | Freq: Once | INTRAMUSCULAR | Status: AC
  Administered 2024-08-04: 4 mg via INTRAVENOUS
  Filled 2024-08-04: qty 2

## 2024-08-04 MED ORDER — ENOXAPARIN SODIUM 40 MG/0.4ML IJ SOSY
40.0000 mg | PREFILLED_SYRINGE | INTRAMUSCULAR | Status: DC
  Administered 2024-08-04: 40 mg via SUBCUTANEOUS
  Filled 2024-08-04: qty 0.4

## 2024-08-04 MED ORDER — ONDANSETRON HCL 4 MG PO TABS: 4.0000 mg | ORAL_TABLET | Freq: Four times a day (QID) | ORAL | Status: DC | PRN

## 2024-08-04 MED ORDER — ACETAMINOPHEN 325 MG RE SUPP: 650.0000 mg | RECTAL | Status: DC | PRN

## 2024-08-04 MED ORDER — BUPROPION HCL ER (XL) 150 MG PO TB24
150.0000 mg | ORAL_TABLET | Freq: Every day | ORAL | Status: DC
  Filled 2024-08-04: qty 1

## 2024-08-04 MED ORDER — ONDANSETRON HCL 4 MG/2ML IJ SOLN
4.0000 mg | Freq: Four times a day (QID) | INTRAMUSCULAR | Status: DC | PRN
  Administered 2024-08-04: 4 mg via INTRAVENOUS
  Filled 2024-08-04: qty 2

## 2024-08-04 MED ORDER — ORAL CARE MOUTH RINSE
15.0000 mL | OROMUCOSAL | Status: DC
  Filled 2024-08-04 (×13): qty 15

## 2024-08-04 MED ORDER — GADOBUTROL 1 MMOL/ML IV SOLN
9.0000 mL | Freq: Once | INTRAVENOUS | Status: AC | PRN
  Administered 2024-08-04: 9 mL via INTRAVENOUS

## 2024-08-04 MED ORDER — LORAZEPAM 2 MG/ML IJ SOLN: 2.0000 mg | INTRAMUSCULAR | Status: DC | PRN

## 2024-08-04 MED ORDER — SODIUM CHLORIDE 0.9 % IV SOLN: 75.0000 mL/h | INTRAVENOUS | Status: DC

## 2024-08-04 MED ORDER — ASPIRIN 81 MG PO TBEC
81.0000 mg | DELAYED_RELEASE_TABLET | Freq: Every day | ORAL | Status: DC
  Administered 2024-08-04: 81 mg via ORAL
  Filled 2024-08-04: qty 1

## 2024-08-04 MED ORDER — METOCLOPRAMIDE HCL 5 MG/ML IJ SOLN
10.0000 mg | Freq: Once | INTRAMUSCULAR | Status: AC
  Administered 2024-08-04: 10 mg via INTRAVENOUS
  Filled 2024-08-04: qty 2

## 2024-08-04 MED ORDER — ORAL CARE MOUTH RINSE: 15.0000 mL | OROMUCOSAL | Status: DC | PRN

## 2024-08-04 MED ORDER — ONDANSETRON HCL 4 MG/2ML IJ SOLN: 4.0000 mg | Freq: Once | INTRAMUSCULAR | Status: AC

## 2024-08-04 MED ORDER — ONDANSETRON HCL 4 MG/2ML IJ SOLN
INTRAMUSCULAR | Status: AC
  Administered 2024-08-04: 4 mg via INTRAVENOUS
  Filled 2024-08-04: qty 2

## 2024-08-04 MED ORDER — ATORVASTATIN CALCIUM 20 MG PO TABS
20.0000 mg | ORAL_TABLET | Freq: Every day | ORAL | Status: DC
  Administered 2024-08-04: 20 mg via ORAL
  Filled 2024-08-04: qty 1

## 2024-08-04 NOTE — Assessment & Plan Note (Addendum)
 History of seizure-like disorder/syncope 6-03/2024 Consideration of medication related seizure Patient loaded in the ED Continue Keppra  IV with Ativan  as needed seizure Fall seizure and aspiration precautions EEG Consider neurology consult in the a.m. Hold bupropion , though patient not sure she is taking it

## 2024-08-04 NOTE — ED Notes (Signed)
 Pt sleeping  family with pt.  Sinus on monitor.  Seizure pads in place.  Pt on 4 liters oxygen Melody Schaefer.

## 2024-08-04 NOTE — ED Notes (Addendum)
 Melody Schaefer

## 2024-08-04 NOTE — ED Notes (Signed)
 Will move pt to floor when EEG complete

## 2024-08-04 NOTE — ED Notes (Signed)
 Pt return from ct scan with nurse and ed tech.  Pt postictal.  Pt began vomiting large amounts of emesis

## 2024-08-04 NOTE — ED Notes (Signed)
 MRI reports pt with emesis when moved on to table. Prn given

## 2024-08-04 NOTE — ED Notes (Signed)
 Tele Rep-Amy asked if MRI had be completed.; Informed Rep-Amy no MRI was ordered only CT scans were ordered & completed

## 2024-08-04 NOTE — Hospital Course (Signed)
 Melody Schaefer

## 2024-08-04 NOTE — Assessment & Plan Note (Signed)
 Likely secondary to seizure Acute infection not suspected Continue to trend

## 2024-08-04 NOTE — ED Notes (Signed)
 Pt vomiting again  meds given.

## 2024-08-04 NOTE — ED Notes (Signed)
 Family in hallway yelling for nurse and doctor.  Pt having a seizure, dr ward at bedside, meds ordered and given by this rn.  2nd iv started and EKG done.  Pt placed on nonrerebreather.  Seizure pads placed on bed.   Family member yelling at staff and md.  Security with pt and family and escorted out of room per dr ward.  Family in hallway with security.

## 2024-08-04 NOTE — H&P (Signed)
 History and Physical    Patient: Melody Schaefer FMW:[id number] DOB: 01/16/1966 DOA: 08/03/2024 DOS: the patient was seen and examined on 08/04/2024 PCP: Derick Leita POUR, MD  Patient coming from: Home  Chief Complaint:  Chief Complaint  Patient presents with   Seizures    HPI: Melody Schaefer is a 58 y.o. adult with medical history significant for HTN, HLD,  being admitted with recurrent tonic-clonic seizures, one at home and one while in the ED.  Patient does not have an established diagnosis of seizure disorder however in June 2025 she was seen in the ED for seizure-like episode and was treated and discharged without AEDs.  The following month in July, she was hospitalized with a syncopal episode with fall in which she sustained a laceration.  EEG was negative, MRI at that time was concerning for a possible vertebral dissection.  She was evaluated by neurology with recommendation for DAPT with repeat CTA head and neck in 3 to 6 months.  A convulsive syncopal episode was considered rather than an epileptic seizure(per note as etiology. Today, patient was in her usual state of health when family noted the patient with having an episode of whole body jerking with foaming at the mouth that lasted about 10 minutes following which she was altered for 20 minutes.  The second episode , witnessed by the ED provider showed gaze deviation, generalized tonic-clonic activity, tongue biting and was aborted with Ativan .  She vomited a couple times after the episode. ED course: On arrival she was afebrile with BP 171/100 and pulse 114 and O2 sat 95% on room air. Labs essentially unremarkable except for leukocytosis of 17,000 and creatinine elevation of 1.04 above baseline of 0.61 about 4 months prior.  CBC, CMP, magnesium, UA, EtOH otherwise unremarkable. Chest x-ray clear CTA head and neck negative for LVO or emergent finding.  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 Of note, family requested transfer to [facility].  The ED provider called and spoke with [facility] neurology who declined.   Admission requested.     Past Medical History:  Diagnosis Date   Hypertension    Past Surgical History:  Procedure Laterality Date   TUBAL LIGATION     Social History:  reports that Melody Schaefer has quit smoking. Melody Schaefer's smoking use included cigarettes. Melody Schaefer has never used smokeless tobacco. Melody Schaefer reports current alcohol use. Melody Schaefer reports that Melody Schaefer does not use drugs.  No Known Allergies  Family History  Problem Relation Age of Onset   Healthy Mother    Cancer Father    Breast cancer Neg Hx     Prior to Admission medications   Medication Sig Start Date End Date Taking? Authorizing Provider  acetaminophen  (TYLENOL ) 325 MG tablet Take 2 tablets (650 mg total) by mouth every 6 (six) hours as needed for mild pain (pain score 1-3), fever, headache or moderate pain (pain score 4-6) (or Fever >/= 101). 03/20/24  Yes Von Bellis, MD  aspirin  EC 81 MG tablet Take 1 tablet (81 mg total) by mouth daily. Swallow whole. 03/21/24  Yes Von Bellis, MD  atorvastatin  (LIPITOR) 20 MG tablet Take 20 mg by mouth daily.   Yes [provider]  buPROPion  (WELLBUTRIN  XL) 150 MG 24 hr tablet Take 150 mg by mouth daily. 06/27/24  Yes [provider]  clopidogrel  (PLAVIX ) 75 MG tablet Take 75 mg by mouth daily.   Yes [provider]  cyanocobalamin  (VITAMIN  B12) 1000 MCG tablet Take 1,000 mcg by mouth daily. 03/27/24  Yes [provider]  losartan  (COZAAR ) 25 MG tablet Take 1 tablet (25 mg total) by mouth daily. 03/21/24 03/21/25 Yes Von Bellis, MD  omeprazole (PRILOSEC) 20 MG capsule Take 20 mg by mouth daily. Patient not taking: Reported on 08/04/2024 05/06/24   [provider]  pantoprazole (PROTONIX) 20 MG tablet Take 20 mg by mouth daily. Patient not taking: Reported on 08/04/2024 08/01/24   [provider]    Physical Exam: Vitals:   08/04/24 0050 08/04/24 0100 08/04/24 0130 08/04/24 0200  BP: (!) 172/98 (!) 170/99 (!) 169/97 (!) 147/85  Pulse: (!) 133 (!) 106 97 95  Resp: 20 (!) 24 (!) 23 20  Temp:      TempSrc:      SpO2: 98% 96% 92% 94%  Weight:      Height:       Physical Exam Vitals and nursing note reviewed.  Constitutional:      General: Melody Schaefer is not in acute distress.    Comments: Somnolent from Ativan  administered in the ED.  Arousable and will answer questions.  HENT:     Head: Normocephalic and atraumatic.  Cardiovascular:     Rate and Rhythm: Normal rate and regular rhythm.     Heart sounds: Normal heart sounds.  Pulmonary:     Effort: Pulmonary effort is normal.     Breath sounds: Normal breath sounds.  Abdominal:     Palpations: Abdomen is soft.     Tenderness: There is no abdominal tenderness.  Neurological:     General: No focal deficit present.     Labs on Admission: I have personally reviewed following labs and imaging studies  CBC: Recent Labs  Lab 08/03/24 2320  WBC 17.5*  NEUTROABS 14.5*  HGB 13.3  HCT 39.3  MCV 92.5  PLT 289   Basic Metabolic Panel: Recent Labs  Lab 08/03/24 2320  NA 139  K 3.8  CL 104  CO2 24  GLUCOSE 168*  BUN 17  CREATININE 1.04*  CALCIUM  9.0  MG 2.1   GFR: Estimated Creatinine Clearance (by C-G formula based on SCr of 1.04 mg/dL (H)) Female: 27.0 mL/min (A) Female: 88.7 mL/min (A) Liver Function Tests: Recent Labs  Lab 08/03/24 2320  AST 20  ALT 17  ALKPHOS 103  BILITOT 0.2  PROT 6.5  ALBUMIN 4.3   No results for input(s): LIPASE, AMYLASE in the last 168 hours. No results for input(s): AMMONIA in the last 168 hours. Coagulation Profile: No results for input(s): INR, PROTIME in the last 168 hours. Cardiac Enzymes: No results for input(s): CKTOTAL, CKMB, CKMBINDEX, TROPONINI in the last 168 hours. BNP (last 3 results) No results for input(s): PROBNP in the  last 8760 hours. HbA1C: No results for input(s): HGBA1C in the last 72 hours. CBG: Recent Labs  Lab 08/03/24 2330  GLUCAP 149*   Lipid Profile: No results for input(s): CHOL, HDL, LDLCALC, TRIG, CHOLHDL, LDLDIRECT in the last 72 hours. Thyroid Function Tests: No results for input(s): TSH, T4TOTAL, FREET4, T3FREE, THYROIDAB in the last 72 hours. Anemia Panel: No results for input(s): VITAMINB12, FOLATE, FERRITIN, TIBC, IRON, RETICCTPCT in the last 72 hours. Urine analysis:    Component Value Date/Time   COLORURINE YELLOW (A) 08/03/2024 0006   APPEARANCEUR CLEAR (A) 08/03/2024 0006   LABSPEC 1.024 08/03/2024 0006   PHURINE 5.0 08/03/2024 0006   GLUCOSEU 50 (A) 08/03/2024 0006   HGBUR NEGATIVE 08/03/2024 0006  BILIRUBINUR NEGATIVE 08/03/2024 0006   KETONESUR NEGATIVE 08/03/2024 0006   PROTEINUR NEGATIVE 08/03/2024 0006   NITRITE NEGATIVE 08/03/2024 0006   LEUKOCYTESUR NEGATIVE 08/03/2024 0006    Radiological Exams on Admission: DG Chest Portable 1 View Result Date: 08/04/2024 EXAM: 1 VIEW(S) XRAY OF THE CHEST 08/04/2024 12:57:00 AM COMPARISON: None available. CLINICAL HISTORY: started vomiting with NRB on, eval for aspiration FINDINGS: LUNGS AND PLEURA: Left lung base atelectasis. No pleural effusion. No pneumothorax. HEART AND MEDIASTINUM: No acute abnormality of the cardiac and mediastinal silhouettes. BONES AND SOFT TISSUES: No acute osseous abnormality. IMPRESSION: 1. Left lung base atelectasis. Electronically signed by: Oneil Devonshire MD 08/04/2024 01:02 AM EST RP Workstation: [id number]   CT ANGIO HEAD NECK W WO CM Result Date: 08/04/2024 CLINICAL DATA:  Initial evaluation for acute seizure. EXAM: CT ANGIOGRAPHY HEAD AND NECK WITH AND WITHOUT CONTRAST TECHNIQUE: Multidetector CT imaging of the head and neck was performed using the standard protocol during bolus administration of intravenous contrast. Multiplanar CT image reconstructions and  MIPs were obtained to evaluate the vascular anatomy. Carotid stenosis measurements (when applicable) are obtained utilizing NASCET criteria, using the distal internal carotid diameter as the denominator. RADIATION DOSE REDUCTION: This exam was performed according to the departmental dose-optimization program which includes automated exposure control, adjustment of the mA and/or kV according to patient size and/or use of iterative reconstruction technique. CONTRAST:  75mL OMNIPAQUE  IOHEXOL  350 MG/ML SOLN COMPARISON:  Prior CT from 03/19/2024. FINDINGS: CT HEAD FINDINGS Brain: Mild age-related cerebral atrophy. Patchy hypodensity involving the supratentorial cerebral white matter, consistent with chronic small vessel ischemic disease. No acute intracranial hemorrhage. No acute large vessel territory infarct. No mass lesion or midline shift. No hydrocephalus or extra-axial fluid collection. Vascular: No abnormal hyperdense vessel. Skull: Scalp soft tissues demonstrate no acute finding. Calvarium intact. Sinuses/Orbits: Globes orbital soft tissues within normal limits. Paranasal sinuses and mastoid air cells are largely clear. Other: None. Review of the MIP images confirms the above findings CTA NECK FINDINGS Aortic arch: Visualized aortic arch within normal limits for caliber with standard 3 vessel morphology. Mild aortic atherosclerosis. No stenosis about the origin of the great vessels. Right carotid system: Right common and internal carotid arteries are patent without stenosis or dissection. Left carotid system: Left common and internal carotid arteries are patent without stenosis or dissection. Mild atheromatous change about the left carotid bulb without stenosis. Vertebral arteries: Both vertebral arteries arise from the subclavian arteries. Right vertebral artery strongly dominant and patent without stenosis or dissection. 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 Hypoplastic left vertebral artery patent to approximately the level of C2, but no significant flow distally into the skull base. Appearance is stable from prior. Skeleton: No worrisome osseous lesions. Other neck: No other acute finding. 1.3 cm left thyroid nodule noted, of doubtful significance given size and patient age, no follow-up imaging recommended (ref: J Am Coll Radiol. 2015 Feb;12(2): 143-50). Upper chest: No other acute finding. Review of the MIP images confirms the above findings CTA HEAD FINDINGS Anterior circulation: Examination mildly degraded by motion artifact. Atheromatous change about the carotid siphons without hemodynamically significant stenosis. A1 segments patent bilaterally. Normal anterior communicating artery complex. Anterior cerebral arteries patent without visible stenosis. No M1 stenosis or occlusion. Right MCA bifurcates early. No proximal MCA branch occlusion or high-grade stenosis. Distal MCA branches perfused and symmetric. Posterior circulation: Dominant right V4 segment widely patent. Right PICA origin not well seen. Left vertebral artery  markedly hypoplastic. Retrograde filling of the left V4 segment with perfusion of the left PICA. Basilar patent without stenosis. Superior cerebral arteries patent bilaterally. Right PCA supplied via the basilar. Left PCA supplied via a hypoplastic left P1 segment and prominent left posterior communicating artery. Both PCAs patent to their distal aspects without stenosis. Venous sinuses: Patent allowing for timing the contrast bolus. Anatomic variants: As above.  No aneurysm. Review of the MIP images confirms the above findings IMPRESSION: CT HEAD: 1. No acute intracranial abnormality. 2. Mild age-related cerebral atrophy with chronic small vessel ischemic disease. CTA HEAD AND NECK: 1. Negative CTA for large vessel occlusion or other emergent finding. 2. 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 Left vertebral artery contributes little to the posterior circulation, with retrograde filling of the left V4 segment and perfusion of the left PICA. Strongly dominant right vertebral artery widely patent. 3. Mild atheromatous change about the carotid bifurcations and carotid siphons without hemodynamically significant stenosis. 4.  Aortic Atherosclerosis (ICD10-I70.0). Aortic Atherosclerosis (ICD10-I70.0). Electronically Signed   By: Morene Hoard M.D.   On: 08/04/2024 00:40   Data Reviewed for HPI: Relevant notes from primary care and specialist visits, past discharge summaries as available in EHR, including Care Everywhere. Prior diagnostic testing as pertinent to current admission diagnoses Updated medications and problem lists for reconciliation ED course, including vitals, labs, imaging, treatment and response to treatment Triage notes, nursing and pharmacy notes and ED provider's notes Notable results as noted above in HPI      Assessment and Plan: * Generalized tonic-clonic seizure, recurrent, new onset (HCC) History of seizure-like disorder/syncope 6-03/2024 Consideration of medication related seizure Patient loaded in the ED Continue Keppra  IV with Ativan  as needed seizure Fall seizure and aspiration precautions EEG Consider neurology consult in the a.m. 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 Observation status is judged to be reasonable and necessary in order to provide the required intensity of service to ensure the patient's safety. The patient's presenting symptoms, physical exam findings, and initial radiographic and laboratory data in the context of their medical condition is felt to place them at decreased risk for further clinical deterioration. Furthermore, it is anticipated that the patient will be medically stable for discharge from the hospital within 2 midnights of admission.   Author: Delayne LULLA Solian, MD 08/04/2024 3:03 AM  For on call review [email or web address].

## 2024-08-04 NOTE — ED Notes (Addendum)
 Md at bedside with family.  Sinus tach on monitor.  Pt on 4 liters oxygen [place].  Meds given for nausea.  Iv fluids infusing.

## 2024-08-04 NOTE — Assessment & Plan Note (Signed)
 Continue home losartan

## 2024-08-04 NOTE — ED Notes (Signed)
 Per Dr Michaela, pt potential discharge at this time

## 2024-08-04 NOTE — ED Notes (Signed)
 Demographic sheet sent/ imaging powershared

## 2024-08-04 NOTE — Procedures (Signed)
 History: 58 year old female with a witnessed seizure, EEG to evaluate for seizures  EEG Duration: 23 minutes  Sedation: None  Patient State: Awake and asleep  Technique: This EEG was acquired with electrodes placed according to the International 10-20 electrode system (including Fp1, Fp2, F3, F4, C3, C4, P3, P4, O1, O2, T3, T4, T5, T6, A1, A2, Fz, Cz, Pz). The following electrodes were missing or displaced: none.   Background: The background consists of intermixed alpha and beta activities. There is a well defined posterior dominant rhythm of nine hz that attenuates with eye opening.  There is a fast variant PDR seen at times.  Sleep is recorded with normal appearing structures.   Photic stimulation: Physiologic driving is   EEG Abnormalities: None  Clinical Interpretation: This normal EEG is recorded in the waking and sleep state. There was no seizure or seizure predisposition recorded on this study. Please note that lack of epileptiform activity on EEG does not preclude the possibility of epilepsy.   Melody Seals, MD [facility]   If 7pm- 7am, please page neurology on call as listed in AMION.

## 2024-08-04 NOTE — Assessment & Plan Note (Signed)
 Continue atorvastatin 

## 2024-08-04 NOTE — Assessment & Plan Note (Signed)
 Holding Wellbutrin , due to risk of seizure

## 2024-08-04 NOTE — ED Notes (Signed)
 Patient cleaned up after an episode of urinary incontinence.  A full linen change was completed. Bed returned to lowest position with call light in reach. Patient denies other needs at this time.

## 2024-08-04 NOTE — Consult Note (Addendum)
 NEUROLOGY CONSULT NOTE   Date of service: August 04, 2024 Patient Name: Melody Schaefer MRN:  [id number] DOB:  1965/10/01 Chief Complaint: seizure Requesting Provider: Leesa Kast, DO  History of Present Illness  Melody Schaefer is a 58 y.o. adult with hx of hypertension who presents with third lifetime episode concerning for seizure.  The first occurred in June, while she was at work and there was report of some type of convulsive activity lasting 2 to 3 minutes.  She did not have any incontinence or tongue biting.  She reported at the time, that she felt hot beforehand and was concerned she might of passed out due to heat.  She had a second episode in July which was unwitnessed, she went into change, and then passed out and left the changing room and was surprised that she had not changed herself, so there is some suggestion of possible confusion.  There was no tongue biting or incontinence with that episode either.  With the most recent episode, they were watching TV and then the patient's son was alerted that something was wrong because his mother's feet kept hitting the floor, when they found her she was having a generalized convulsion with arms bilaterally flexed, and she seized for 5 to 10 minutes.  Her postictal breathing pattern was described as snoring.  And she began talking 1 to 2 minutes after cessation of seizure activity, but with some confusion and unawareness of the event.  She did not have urinary incontinence or tongue biting with this event either.  Past History   Past Medical History:  Diagnosis Date   Hypertension     Past Surgical History:  Procedure Laterality Date   TUBAL LIGATION      Family History: Family History  Problem Relation Age of Onset   Healthy Mother    Cancer Father    Breast cancer Neg Hx     Social History  reports that Andreal has quit smoking. Carmelite's smoking use included cigarettes. Hannelore has never used smokeless  tobacco. Madelyn reports current alcohol use. Rudean reports that Joelee does not use drugs.  No Known Allergies  Medications   Current Facility-Administered Medications:    0.9 %  sodium chloride  infusion, , Intravenous, Continuous, Ward, Josette SAILOR, DO, Last Rate: 125 mL/hr at 08/04/24 0029, New Bag at 08/04/24 0029   0.9 %  sodium chloride  infusion, 75 mL/hr, Intravenous, Continuous, Cleatus Delayne GAILS, MD   acetaminophen  (TYLENOL ) tablet 650 mg, 650 mg, Oral, Q4H PRN **OR** acetaminophen  (TYLENOL ) suppository 650 mg, 650 mg, Rectal, Q4H PRN, Cleatus Delayne GAILS, MD   aspirin  EC tablet 81 mg, 81 mg, Oral, Daily, Cleatus Delayne V, MD, 81 mg at 08/04/24 0759   atorvastatin  (LIPITOR) tablet 20 mg, 20 mg, Oral, Daily, Cleatus Delayne V, MD, 20 mg at 08/04/24 9241   buPROPion  (WELLBUTRIN  XL) 24 hr tablet 150 mg, 150 mg, Oral, Daily, Cleatus Delayne V, MD   enoxaparin  (LOVENOX ) injection 40 mg, 40 mg, Subcutaneous, Q24H, Cleatus Delayne V, MD, 40 mg at 08/04/24 0920   levETIRAcetam  (KEPPRA ) undiluted injection 500 mg, 500 mg, Intravenous, Q12H, Cleatus Delayne V, MD, 500 mg at 08/04/24 0753   LORazepam  (ATIVAN ) injection 2 mg, 2 mg, Intravenous, Q5 Min x 2 PRN, Cleatus Delayne GAILS, MD   losartan  (COZAAR ) tablet 25 mg, 25 mg, Oral, Daily, Cleatus Delayne V, MD, 25 mg at 08/04/24 0759   ondansetron  (ZOFRAN ) tablet 4 mg, 4 mg, Oral, Q6H PRN **OR** ondansetron  (ZOFRAN ) injection 4  mg, 4 mg, Intravenous, Q6H PRN, Cleatus Delayne GAILS, MD   Oral care mouth rinse, 15 mL, Mouth Rinse, Q2H, Cleatus Delayne GAILS, MD   Oral care mouth rinse, 15 mL, Mouth Rinse, PRN, Cleatus Delayne GAILS, MD  Current Outpatient Medications:    acetaminophen  (TYLENOL ) 325 MG tablet, Take 2 tablets (650 mg total) by mouth every 6 (six) hours as needed for mild pain (pain score 1-3), fever, headache or moderate pain (pain score 4-6) (or Fever >/= 101)., Disp: , Rfl:    aspirin  EC 81 MG tablet, Take 1 tablet (81 mg total) by mouth daily. Swallow whole., Disp: 30  tablet, Rfl: 12   atorvastatin  (LIPITOR) 20 MG tablet, Take 20 mg by mouth daily., Disp: , Rfl:    buPROPion  (WELLBUTRIN  XL) 150 MG 24 hr tablet, Take 150 mg by mouth daily., Disp: , Rfl:    clopidogrel  (PLAVIX ) 75 MG tablet, Take 75 mg by mouth daily., Disp: , Rfl:    cyanocobalamin  (VITAMIN B12) 1000 MCG tablet, Take 1,000 mcg by mouth daily., Disp: , Rfl:    losartan  (COZAAR ) 25 MG tablet, Take 1 tablet (25 mg total) by mouth daily., Disp: 30 tablet, Rfl: 11   omeprazole (PRILOSEC) 20 MG capsule, Take 20 mg by mouth daily. (Patient not taking: Reported on 08/04/2024), Disp: , Rfl:    pantoprazole (PROTONIX) 20 MG tablet, Take 20 mg by mouth daily. (Patient not taking: Reported on 08/04/2024), Disp: , Rfl:   Vitals   Vitals:   08/04/24 0630 08/04/24 0753 08/04/24 0800 08/04/24 0830  BP: 138/75  (!) 144/84 (!) 151/91  Pulse: 77  93 82  Resp: 14  18 19   Temp:  97.7 F (36.5 C)    TempSrc:      SpO2: 95%  98% 95%  Weight:      Height:        Body mass index is 28.31 kg/m.   Physical Exam   Constitutional: Appears well-developed and well-nourished.   Neurologic Examination    Neuro: Mental Status: Patient is awake, alert, interactive and appropriate  patient is able to give a clear and coherent history. No signs of aphasia or neglect Cranial Nerves: II: Visual Fields are full. Pupils are equal, round, and reactive to light.   III,IV, VI: EOMI without ptosis or diploplia.  V: Facial sensation is symmetric to temperature VII: Facial movement is symmetric.  VIII: hearing is intact to voice X: Uvula elevates symmetrically XII: tongue is midline without atrophy or fasciculations.  Motor: 5/5 strength was present in all four extremities.  Sensory: Sensation is symmetric to light touch and temperature in the arms and legs. Cerebellar: FNF and HKS are intact bilaterally    Labs/Imaging/Neurodiagnostic studies   CBC:  Recent Labs  Lab 08-21-2024 2320  WBC 17.5*   NEUTROABS 14.5*  HGB 13.3  HCT 39.3  MCV 92.5  PLT 289   Basic Metabolic Panel:  Lab Results  Component Value Date   NA 139 08-21-24   K 3.8 08-21-2024   CO2 24 Aug 21, 2024   GLUCOSE 168 (H) Aug 21, 2024   BUN 17 08/21/24   CREATININE 1.04 (H) 08/21/2024   CALCIUM  9.0 08-21-2024   GFRNONAA >60 08-21-2024   HgbA1c:  Lab Results  Component Value Date   HGBA1C 5.5 03/18/2024   Urine Drug Screen:     Component Value Date/Time   LABOPIA NEGATIVE 08/21/2024 0006   COCAINSCRNUR NEGATIVE 08-21-24 0006   COCAINSCRNUR NONE DETECTED 03/19/2024 1730   LABBENZ NEGATIVE 08-21-24  0006   AMPHETMU NEGATIVE 08/03/2024 0006   THCU NEGATIVE 08/03/2024 0006   LABBARB NEGATIVE 08/03/2024 0006    Alcohol Level     Component Value Date/Time   [facility] <15 08/03/2024 2320    CT Head without contrast(Personally reviewed): Negative  CT angio Head and Neck with contrast(Personally reviewed): Small left vertebral   ASSESSMENT   TYRIHANNA WINGERT is a 58 y.o. adult with recurrent episodes concerning for seizure.  Certainly the most recent episode by description is much more consistent with seizure than syncope.  She is on Wellbutrin , which can cause seizures, and so even though she has had a total of three lifetime seizures, I think would be reasonable to simply stop the Wellbutrin  given that she will be having driving prohibitions in any case.  If MRI and EEG do not reveal any evidence of a seizure predisposition, then I think it can be a discussion with the patient as to whether she would want to start on antiepileptic therapy.  If she were not on a medication known to lower seizure threshold, my recommendation would be yes, but it is less clear in the setting.  RECOMMENDATIONS  Discontinue Wellbutrin  MRI and EEG Decision on continued antiepileptic following the MRI and EEG ______________________________________________________________________  Signed, Aisha Seals, MD [facility]   Addendum:  MRI and EEG are both normal, I discussed with the patient that it is possible that the seizure was a side effect of her Wellbutrin , and in the setting, I am not certain that she needs lifelong antiepileptic therapy.  Given that she will not be driving for the next 6 months, my recommendation will be to hold off and only start antiepileptics if she were to have another seizure.  The patient agreed and expressed understanding of the driving prohibitions.  Patient is unable to drive, operate heavy machinery, perform activities at heights or participate in water activities until release by outpatient physician. This was discussed with the patient who expressed understanding.    Aisha Seals, MD [facility]   If 7pm- 7am, please page neurology on call as listed in AMION.

## 2024-08-04 NOTE — Discharge Summary (Signed)
 Physician Discharge Summary   Patient: Melody Schaefer MRN: [id number] DOB: Aug 31, 1966  Admit date:     08/03/2024  Discharge date: 08/04/24  Discharge Physician: Lorane Poland   PCP: Derick Leita POUR, MD   Recommendations at discharge:  1.  Follow-up closely with your primary care physician to discuss alternative medication for depression treatment and chronic condition management 2.   Follow-up with neurology  Discharge Diagnoses: Principal Problem:   Generalized tonic-clonic seizure, recurrent, new onset (HCC) Active Problems:   AKI (acute kidney injury)   Leukocytosis   HTN (hypertension)   HLD (hyperlipidemia)   History of syncope 03/2024   Depression   Recurrent seizures (HCC)  Resolved Problems:   * No resolved hospital problems. *  Hospital Course: Melody Schaefer 58 year old with hypertension, hyperlipidemia, who presents with generalized tonic-clonic seizure.  Prior to this admission she does not have seizure disorder diagnosis however she was seen 2 times prior in this ED for seizure-like episodes and worked up.  EEG and MRI in the past have been mostly unremarkable.  She was previously thought to have a convulsive syncopal episode rather than epileptic seizure. Prior to arrival she had witnessed full-body jerking and mouth foaming with a postictal period.  A second episode was witnessed by the EDP which revealed gaze deviation, generalized tonic-clonic activity, tongue biting, and was aborted with Ativan .  She also vomited a couple of times after this episode. She was admitted for workup.  Neurology was consulted She was loaded with Keppra  and started on 500 twice daily. CTA head and neck was negative for LVO or emergent finding.  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 Brain MRI is without acute abnormality.  EEG unremarkable. Neurology had extensive discussion with the family  regarding initiation of antiepileptic drugs versus discontinuing Wellbutrin  and monitoring closely.  Patient and her family have elected to proceed without AEDs at this time and instead would like to discontinue Wellbutrin .  She was given strict ER return precautions.  She was advised she cannot drive, operate heavy machinery, or be left unattended if taking a bath/swimming.  She endorses understanding.  Her family, 2 sons, were also present at bedside for this discussion and agree.  Generalized tonic clonic seizure - May be medication related.  Have discontinued Wellbutrin . - UDS and EtOH negative - No other organic etiology identified - Initially loaded with Keppra , family has decided to proceed without AEDs - Brain MRI without acute pathology - EEG WNL - No driving for 6 months after seizure - Referral to neurology, patient previously established with Dr. Maree  AKI - Baseline creatinine 0.61, 1.04 on arrival.  Resolved on repeat after IV fluids  Leukocytosis - 17.5 on arrival, likely reactive.  No signs or symptoms of infection.  Resolved to 13.1 without intervention  Depression? - Patient reports she was started on Wellbutrin  to assist with smoking cessation.  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  Discontinue Plavix .  Proceed with aspirin  only  Hyperlipidemia - Continue statin  Hypertension - Continue ARB  BMI 28 Overweight - Outpatient follow up for lifestyle modification and risk factor management  Consultants: Neurology  Disposition: Home Diet recommendation:  Discharge Diet Orders (From admission, onward)     Start     Ordered   08/04/24 0000  Diet general        08/04/24 1606            Cardiac diet DISCHARGE MEDICATION: Allergies as of 08/04/2024   No Known Allergies      Medication List     STOP taking these medications    buPROPion  150 MG 24 hr tablet Commonly known as: WELLBUTRIN  XL   clopidogrel  75 MG tablet Commonly known as: PLAVIX    omeprazole 20 MG capsule Commonly known as: PRILOSEC   pantoprazole 20 MG tablet Commonly known as: PROTONIX       TAKE these medications    acetaminophen  325 MG tablet Commonly known as: TYLENOL  Take 2 tablets (650 mg total) by mouth every 6 (six) hours as needed for mild pain (pain score 1-3), fever, headache or moderate pain (pain score 4-6) (or Fever >/= 101).   aspirin  EC 81 MG tablet Take 1 tablet (81 mg total) by mouth daily. Swallow whole.   atorvastatin  20 MG tablet Commonly known as: LIPITOR Take 20 mg by mouth daily.   cyanocobalamin  1000 MCG tablet Commonly known as: VITAMIN B12 Take 1,000 mcg by mouth daily.   losartan  25 MG tablet Commonly known as: COZAAR  Take 1 tablet (25 mg total) by mouth daily.        Discharge Exam: Filed Weights   08/03/24 2237  Weight: 92.1 kg   Constitutional:  Normal appearance. Non toxic-appearing.  HENT: Head Normocephalic and atraumatic.  Mucous membranes are moist.  Eyes:  Extraocular intact. Conjunctivae normal. Pupils are equal, round, and reactive to light.  Cardiovascular: Rate and Rhythm: Normal rate and regular rhythm.  Pulmonary: Non labored, symmetric rise of chest wall.  Musculoskeletal:  Normal range of motion.  Skin: warm and dry. not jaundiced.  Neurological: No focal deficit present.  Drowsy but easily arousable.  Answers all questions.  Oriented x 3. Psychiatric: Mood and Affect congruent.    Condition at discharge: Discharging home with her sons  The results of significant diagnostics from this hospitalization (including imaging, microbiology, ancillary and laboratory) are listed below for reference.   Imaging  Studies: EEG adult Result Date: 08/04/2024 Melody Aisha SQUIBB, MD     08/04/2024  2:35 PM History: 58 year old female with a witnessed seizure, EEG to evaluate for seizures EEG Duration: 23 minutes Sedation: None Patient State: Awake and asleep Technique: This EEG was acquired with electrodes placed according to the International 10-20 electrode system (including Fp1, Fp2, F3, F4, C3, C4, P3, P4, O1, O2, T3, T4, T5, T6, A1, A2, Fz, Cz, Pz). The following electrodes were missing or displaced: none. Background: The background consists of intermixed alpha and beta activities. There is a well defined posterior dominant rhythm of nine hz that attenuates with eye opening.  There is a fast variant PDR seen at times.  Sleep is recorded with normal appearing structures. Photic stimulation: Physiologic driving is EEG Abnormalities: None Clinical Interpretation: This normal EEG is recorded in the waking and sleep state. There was no seizure or seizure predisposition recorded on this study. Please note that lack of epileptiform activity on EEG does not preclude the possibility of epilepsy. Aisha Michaela, MD [facility] If 7pm- 7am, please page neurology on call as listed in AMION.  MR BRAIN W WO CONTRAST Result Date: 08/04/2024 EXAM: MRI BRAIN WITH AND WITHOUT CONTRAST 08/04/2024 11:08:23 AM TECHNIQUE:  Multiplanar multisequence MRI of the head/brain was performed with and without the administration of 9 mL of gadobutrol  (GADAVIST ) 1 MMOL/ML injection. COMPARISON: CTA head and neck 08/03/2024. MRI head 03/19/2024. CLINICAL HISTORY: Seizure, new-onset, no history of trauma. FINDINGS: The examination is mildly to moderately motion degraded. BRAIN AND VENTRICLES: There is no evidence of an acute infarct, intracranial hemorrhage, mass, midline shift, hydrocephalus, or extra-axial fluid collection. There is mild cerebral atrophy. A partially empty sella is unchanged. No age advanced white matter disease  is evident on this motion degraded study. No abnormal enhancement is identified. The hippocampi are grossly symmetric in size and signal. Diminutive distal left vertebral artery, more fully evaluated on today's CTA. Other major intracranial vascular flow voids are preserved. ORBITS: No acute abnormality. SINUSES: No acute abnormality. BONES AND SOFT TISSUES: Normal bone marrow signal and enhancement. No acute soft tissue abnormality. IMPRESSION: 1. No acute intracranial abnormality or mass. Electronically signed by: Dasie Hamburg MD 08/04/2024 11:20 AM EST RP Workstation: [id number]   DG Chest Portable 1 View Result Date: 08/04/2024 EXAM: 1 VIEW(S) XRAY OF THE CHEST 08/04/2024 12:57:00 AM COMPARISON: None available. CLINICAL HISTORY: started vomiting with NRB on, eval for aspiration FINDINGS: LUNGS AND PLEURA: Left lung base atelectasis. No pleural effusion. No pneumothorax. HEART AND MEDIASTINUM: No acute abnormality of the cardiac and mediastinal silhouettes. BONES AND SOFT TISSUES: No acute osseous abnormality. IMPRESSION: 1. Left lung base atelectasis. Electronically signed by: Oneil Devonshire MD 08/04/2024 01:02 AM EST RP Workstation: [id number]   CT ANGIO HEAD NECK W WO CM Result Date: 08/04/2024 CLINICAL DATA:  Initial evaluation for acute seizure. EXAM: CT ANGIOGRAPHY HEAD AND NECK WITH AND WITHOUT CONTRAST TECHNIQUE: Multidetector CT imaging of the head and neck was performed using the standard protocol during bolus administration of intravenous contrast. Multiplanar CT image reconstructions and MIPs were obtained to evaluate the vascular anatomy. Carotid stenosis measurements (when applicable) are obtained utilizing NASCET criteria, using the distal internal carotid diameter as the denominator. RADIATION DOSE REDUCTION: This exam was performed according to the departmental dose-optimization program which includes automated exposure control, adjustment of the mA and/or kV according to patient size and/or  use of iterative reconstruction technique. CONTRAST:  75mL OMNIPAQUE  IOHEXOL  350 MG/ML SOLN COMPARISON:  Prior CT from 03/19/2024. FINDINGS: CT HEAD FINDINGS Brain: Mild age-related cerebral atrophy. Patchy hypodensity involving the supratentorial cerebral white matter, consistent with chronic small vessel ischemic disease. No acute intracranial hemorrhage. No acute large vessel territory infarct. No mass lesion or midline shift. No hydrocephalus or extra-axial fluid collection. Vascular: No abnormal hyperdense vessel. Skull: Scalp soft tissues demonstrate no acute finding. Calvarium intact. Sinuses/Orbits: Globes orbital soft tissues within normal limits. Paranasal sinuses and mastoid air cells are largely clear. Other: None. Review of the MIP images confirms the above findings CTA NECK FINDINGS Aortic arch: Visualized aortic arch within normal limits for caliber with standard 3 vessel morphology. Mild aortic atherosclerosis. No stenosis about the origin of the great vessels. Right carotid system: Right common and internal carotid arteries are patent without stenosis or dissection. Left carotid system: Left common and internal carotid arteries are patent without stenosis or dissection. Mild atheromatous change about the left carotid bulb without stenosis. Vertebral arteries: Both vertebral arteries arise from the subclavian arteries. Right vertebral artery strongly dominant and patent without stenosis or dissection. 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 Hypoplastic left vertebral artery patent to approximately the level of C2, but no significant flow distally into  the skull base. Appearance is stable from prior. Skeleton: No worrisome osseous lesions. Other neck: No other acute finding. 1.3 cm left thyroid nodule noted, of doubtful significance given size and patient age, no follow-up imaging recommended (ref: J Am Coll Radiol. 2015 Feb;12(2):  143-50). Upper chest: No other acute finding. Review of the MIP images confirms the above findings CTA HEAD FINDINGS Anterior circulation: Examination mildly degraded by motion artifact. Atheromatous change about the carotid siphons without hemodynamically significant stenosis. A1 segments patent bilaterally. Normal anterior communicating artery complex. Anterior cerebral arteries patent without visible stenosis. No M1 stenosis or occlusion. Right MCA bifurcates early. No proximal MCA branch occlusion or high-grade stenosis. Distal MCA branches perfused and symmetric. Posterior circulation: Dominant right V4 segment widely patent. Right PICA origin not well seen. Left vertebral artery markedly hypoplastic. Retrograde filling of the left V4 segment with perfusion of the left PICA. Basilar patent without stenosis. Superior cerebral arteries patent bilaterally. Right PCA supplied via the basilar. Left PCA supplied via a hypoplastic left P1 segment and prominent left posterior communicating artery. Both PCAs patent to their distal aspects without stenosis. Venous sinuses: Patent allowing for timing the contrast bolus. Anatomic variants: As above.  No aneurysm. Review of the MIP images confirms the above findings IMPRESSION: CT HEAD: 1. No acute intracranial abnormality. 2. Mild age-related cerebral atrophy with chronic small vessel ischemic disease. CTA HEAD AND NECK: 1. Negative CTA for large vessel occlusion or other emergent finding. 2. Markedly diminutive and hypoplastic left vertebral artery, stable from prior. 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 Left vertebral artery contributes little to the posterior circulation, with retrograde filling of the left V4 segment and perfusion of the left PICA. Strongly dominant right vertebral artery widely patent. 3. Mild atheromatous change about the carotid bifurcations  and carotid siphons without hemodynamically significant stenosis. 4.  Aortic Atherosclerosis (ICD10-I70.0). Aortic Atherosclerosis (ICD10-I70.0). Electronically Signed   By: Morene Hoard M.D.   On: 08/04/2024 00:40    Microbiology: Results for orders placed or performed during the hospital encounter of 03/18/24  Urine Culture (for pregnant, neutropenic or urologic patients or patients with an indwelling urinary catheter)     Status: Abnormal   Collection Time: 03/19/24  5:30 PM   Specimen: Urine, Clean Catch  Result Value Ref Range Status   Specimen Description   Final    URINE, CLEAN CATCH Performed at [facility], [address], [place], [place] [address]    Special Requests   Final    NONE Performed at [facility], [address] [place], [place] [address]    Culture MULTIPLE SPECIES PRESENT, SUGGEST RECOLLECTION (A)  Final   Report Status 03/21/2024 FINAL  Final    Labs: CBC: Recent Labs  Lab 08/03/24 2320 08/04/24 0950  WBC 17.5* 13.1*  NEUTROABS 14.5* 10.5*  HGB 13.3 12.3  HCT 39.3 37.1  MCV 92.5 92.3  PLT 289 262   Basic Metabolic Panel: Recent Labs  Lab 08/03/24 2320 08/04/24 0950  NA 139 140  K 3.8 3.6  CL 104 107  CO2 24 24  GLUCOSE 168* 85  BUN 17 11  CREATININE 1.04* 0.81  CALCIUM  9.0 8.5*  MG 2.1  --    Liver Function Tests: Recent Labs  Lab 08/03/24 2320 08/04/24 0950  AST 20 24  ALT 17 15  ALKPHOS 103 100  BILITOT 0.2 0.3  PROT 6.5  6.3*  ALBUMIN 4.3 4.0   CBG: Recent Labs  Lab 08/03/24 2330  GLUCAP 149*    Discharge time spent: 32 minutes  Signed: Lailyn Appelbaum, DO [facility] 08/04/2024

## 2024-08-04 NOTE — Assessment & Plan Note (Signed)
 Received an NS bolus in the ED Will continue IV hydration

## 2024-08-04 NOTE — Assessment & Plan Note (Addendum)
 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 

## 2024-08-04 NOTE — Progress Notes (Signed)
 Eeg completed.

## 2024-08-04 NOTE — ED Notes (Signed)
 Called [facility] spoke with Rep-Amy/ Possible possible transfer-Neuro/ waiting on call back

## 2024-08-04 NOTE — ED Notes (Signed)
 EEG at bedside.

## 2024-08-04 NOTE — ED Notes (Signed)
 Family out in hallway wanting to see doctor.  Informed doctor will be in soon.  This rn went in and introduced self to pt and family.  Labs drawn.  Iv in place.

## 2024-08-04 NOTE — ED Notes (Signed)
 Pt titrated to RA.

## 2024-08-21 NOTE — ED Provider Notes (Signed)
 [facility] Emergency Department Provider Note   ED Clinical Impression   Final diagnoses:  Seizure    (CMS-HCC) (Primary)    HPI, Physical Exam   HPI: August 21, 2024 11:47 PM  History of Present Illness Melody Schaefer is a 58 year old female who presents with recurrent seizures.  She has had five seizures over the past month, with the most recent episode occurring today while she was sitting. During today's event she began stuttering, then turned blue, foamed at the mouth, and had generalized convulsions for about two to three minutes, resolving spontaneously. Postictally she was disoriented, could not recall the date or month, and then regained orientation en route to the hospital.  She was previously offered seizure medication but instead stopped Wellbutrin , which she suspected as a trigger, yet she had another seizure two to three weeks later. She had high caffeine intake and recently switched to decaffeinated coffee but drank caffeinated coffee again two days ago.  Her current medications are losartan , atorvastatin , and baby aspirin , with Wellbutrin  discontinued. She quit smoking 1.5 years ago and now vapes. She denies drug use and drinks alcohol only occasionally.  She denies fever, other systemic or respiratory symptoms, chest or abdominal pain, urinary or stool changes, headache, vision changes, numbness, or tingling. She feels slightly sleepy but otherwise well.  Physical Exam:  Constitutional: Alert and oriented. No acute distress. HEENT: Normocephalic and atraumatic. Conjunctivae injected bilaterally. No congestion. Moist mucous membranes.  CV: Tachycardic rate as above, regular rhythm. Normal and symmetric distal pulses. Brisk capillary refill. Normal skin turgor. Pulm: Normal respiratory effort. Breath sounds are normal. There are no wheezing or crackles heard. GI: Soft, non-distended, non-tender. MSK: Non-tender with normal range of motion in all extremities. Neuro:  Full cranial nerve, peripheral motor and sensory as well as cerebellar testing is symmetric and intact. Normal speech and language. Patient is moving all extremities equally, face is symmetric at rest and with speech. Skin: Skin is warm, dry and intact. No rash noted.  Vitals:   08/22/24 0000 08/22/24 0100 08/22/24 0200 08/22/24 0219  BP: 161/99 159/97 145/93   Pulse: 126 104 95   Resp: 25 20 18    Temp:    36.6 C (97.8 F)  TempSrc:    Oral  SpO2: 96% 96% 97%   Weight:      Height:        Medical Decision Making, ED Course   BP 145/93   Pulse 95   Temp 36.6 C (97.8 F) (Oral)   Resp 18   Ht 175.3 cm (5' 9)   Wt 80 kg (176 lb 5.9 oz)   SpO2 97%   BMI 26.05 kg/m   Initial Clinical Impression/DDX/Medical Decision Making  Consideration for this patient's symptoms to be related to an underlying seizure disorder in the setting of this patient having previous seizures with emergency department visits and admission in the setting of reassuring workup and not being on antiseizure medications.  There is also considerations of electrolyte abnormalities which could be contributing to patient's presentation.  This could be that this was a syncopal episode with associated myoclonic jerking however I do feel like the history is most consistent with a seizure with postictal period in the setting of the above.  There are also considerations but low concern for trauma, alcohol use or withdrawal seizure, substance use associated seizure.  In the setting the patient's tachycardia could be that there is underlying electrolyte abnormality contributing to this, could the patient is  dehydrated, could be a thyroid abnormality although lower suspicion.  I also considered ACS and pulmonary embolism but I do not feel the history or physical exam is most consistent with these.  Think is appropriate for us  to obtain basic urine and laboratory studies.  I do not think we need any imaging in the setting of the  patient's reassuring neurologic exam in the absence of any trauma history or signs of increased intracranial pressure.  Patient is already had MRI and EEG completed which was reassuring as recently as the end of November.  I feel like after the laboratory workup would be appropriate for us  to reach out to the neurology team to further discuss.  In the setting the patient's tachycardia will also order electrolytes and cardiac labs as well as administer an IV fluid bolus.  Workup and further updates/updates to plan as per ED Course below:  ED Course: ED Course as of 08/22/24 0248  Sat Aug 21, 2024  2334 Pulse: 142  2336 From chart review patient has history of generalized tonic-clonic seizures, hypertension, hyperlipidemia, depression.  Previous discharge summary reviewed from November 2025 states that the patient presented and was admitted for a seizure in the setting of her denying any seizure diagnosis but with previous emergency department visits for seizure workup.  EEG and MRI at that time and for previous evaluations were unremarkable.  The family elected to avoid antiepileptic medications at the time of discharge in favor of discontinuing Wellbutrin .  Sun Aug 22, 2024  0007 Glucose, POC: 122  0034 WBC(!): 13.3  0034 HGB(!): 15.0  0034 Platelet: 318  0100 Alcohol, Ethyl: <10  0101 hsTroponin I: 16  0101 Magnesium: 2.2  0101 TSH: 2.901 CMP with mildly elevated alkaline phosphatase but otherwise unremarkable  0148 Urinalysis is reassuring.  0153 On my depend review this patient's EKG there is a regular narrow complex sinus rhythm borderline tachycardic at 99 bpm.  There is leftward axis deviation, there are normal intervals, no ST segment or T wave changes raising concern for ischemia or infarction.  No STEMI.  0154 Pulse: 104  0154 TSH: 2.901  0154 hsTroponin I: 16  0154 Magnesium: 2.2    Discussion of Management with other Physicians, QHP or Appropriate Source: If applicable, as  documented in ED course above Independent Interpretation of Studies: If applicable, documented in ED course above. I have reviewed recent and relevant previous record, including: If applicable, inpatient/outpatient notes and prior studies, documented in Impression/MDM     ____________________________________________  The case was discussed with the attending physician, who is in agreement with the above assessment and plan.   Additional History Elements   Chief Complaint Chief Complaint  Patient presents with   Seizure - Re-Evaluation     Past Medical History[1]  Past Surgical History[2]  Allergies Patient has no known allergies.  Family History Family History[3]  Social History Short Social History[4]   Radiology   Results RADIOLOGY MRI: Normal  DIAGNOSTIC REPORTS EEG: Normal  No orders to display    Pertinent labs & imaging results that were available during my care of the patient were independently interpreted by me and considered in my medical decision making (see chart for details).  Portions of this record have been created using Scientist, clinical (histocompatibility and immunogenetics). Dictation errors have been sought, but may not have been identified and corrected.       [1] No past medical history on file. [2] No past surgical history on file. [3] History reviewed. No pertinent  family history. [4]    Jennefer Gee, MD Resident 08/22/24 [phone]

## 2024-09-24 ENCOUNTER — Other Ambulatory Visit: Payer: Self-pay

## 2024-09-24 ENCOUNTER — Emergency Department: Admission: EM | Admit: 2024-09-24 | Discharge: 2024-09-25 | Disposition: A | Discharge status: Home / Self Care

## 2024-09-24 DIAGNOSIS — E876 Hypokalemia: Secondary | ICD-10-CM | POA: Diagnosis not present

## 2024-09-24 DIAGNOSIS — R569 Unspecified convulsions: Secondary | ICD-10-CM | POA: Insufficient documentation

## 2024-09-24 HISTORY — DX: Unspecified convulsions: R56.9

## 2024-09-24 HISTORY — DX: Pure hypercholesterolemia, unspecified: E78.00

## 2024-09-24 LAB — COMPREHENSIVE METABOLIC PANEL WITH GFR
ALT: 13 U/L (ref 0–44)
AST: 18 U/L (ref 15–41)
Albumin: 4 g/dL (ref 3.5–5.0)
Alkaline Phosphatase: 108 U/L (ref 38–126)
Anion gap: 11 (ref 5–15)
BUN: 10 mg/dL (ref 6–20)
CO2: 24 mmol/L (ref 22–32)
Calcium: 8.9 mg/dL (ref 8.9–10.3)
Chloride: 106 mmol/L (ref 98–111)
Creatinine, Ser: 0.82 mg/dL (ref 0.44–1.00)
GFR, Estimated: >60 mL/min
Glucose, Bld: 221 mg/dL — ABNORMAL HIGH (ref 70–99)
Potassium: 3.3 mmol/L — ABNORMAL LOW (ref 3.5–5.1)
Sodium: 141 mmol/L (ref 135–145)
Total Bilirubin: 0.3 mg/dL (ref 0.0–1.2)
Total Protein: 6.5 g/dL (ref 6.5–8.1)

## 2024-09-24 LAB — CBC
HCT: 39.4 % (ref 36.0–46.0)
Hemoglobin: 13 g/dL (ref 12.0–15.0)
MCH: 30.6 pg (ref 26.0–34.0)
MCHC: 33 g/dL (ref 30.0–36.0)
MCV: 92.7 fL (ref 80.0–100.0)
Platelets: 301 K/uL (ref 150–400)
RBC: 4.25 MIL/uL (ref 3.87–5.11)
RDW: 13.1 % (ref 11.5–15.5)
WBC: 9 K/uL (ref 4.0–10.5)
nRBC: 0 % (ref 0.0–0.2)

## 2024-09-24 LAB — CBG MONITORING, ED: Glucose-Capillary: 247 mg/dL — ABNORMAL HIGH (ref 70–99)

## 2024-09-24 MED ORDER — SODIUM CHLORIDE 0.9 % IV BOLUS
1000.0000 mL | Freq: Once | INTRAVENOUS | Status: AC
  Administered 2024-09-24: 1000 mL via INTRAVENOUS

## 2024-09-24 NOTE — ED Provider Notes (Signed)
 "  [facility] Provider Note    Event Date/Time   First MD Initiated Contact with Patient 09/24/24 2256     (approximate)   History   Seizures   HPI  Melody Schaefer is a 59 y.o. adult presenting with concern of seizures.  Today had a 5-minute generalized clonic tonic seizure, seems to have originated in her face, she did notice some twitching behavior along the unilateral aspect of her face and then spread to the rest of the body.  Stopped spontaneously, she had a postictal period afterwards, and then a gradual return to baseline.  Currently complaining of feeling tired but no other symptoms.  States she had a runny nose about a week ago but otherwise has not had any recent fevers chills cough congestion or other symptoms.  She has had a total of 6 seizures now, most recently back in December, she has been seen in the emergency department and had an admission at our facility back in November, which she had a EEG testing as well as MRI done which were without acute findings.  During her most recent visit she was started up on Keppra  currently taking 1 g twice a day.  No other complaints at this time.  She does have a follow-up with the neurologist on Tuesday in the outpatient setting.  She denies any history of alcohol use or other substance use.     Physical Exam   Triage Vital Signs: ED Triage Vitals  Encounter Vitals Group     BP 09/24/24 2221 (!) 156/90     Girls Systolic BP Percentile --      Girls Diastolic BP Percentile --      Boys Systolic BP Percentile --      Boys Diastolic BP Percentile --      Pulse Rate 09/24/24 2221 (!) 125     Resp 09/24/24 2221 20     Temp 09/24/24 2221 99 F (37.2 C)     Temp Source 09/24/24 2221 Oral     SpO2 09/24/24 2221 98 %     Weight 09/24/24 2212 202 lb 9.6 oz (91.9 kg)     Height 09/24/24 2212 5' 11 (1.803 m)     Head Circumference --      Peak Flow --      Pain Score 09/24/24 2217 0     Pain Loc --      Pain  Education --      Exclude from Growth Chart --     Most recent vital signs: Vitals:   09/25/24 0000 09/25/24 0135  BP: (!) 151/85 (!) 153/97  Pulse: 92 85  Resp: (!) 23 (!) 21  Temp:  98.7 F (37.1 C)  SpO2:  98%     General: Awake, no distress.  CV:  Good peripheral perfusion.  Tachycardic Resp:  Normal effort.  Abd:  No distention.  Soft nontender Neuro:  Cranial nerves II to XII are intact, appropriate sensation strength and coordination in all extremities Other:     ED Results / Procedures / Treatments   Labs (all labs ordered are listed, but only abnormal results are displayed) Labs Reviewed  COMPREHENSIVE METABOLIC PANEL WITH GFR - Abnormal; Notable for the following components:      Result Value   Potassium 3.3 (*)    Glucose, Bld 221 (*)    All other components within normal limits  URINALYSIS, ROUTINE W REFLEX MICROSCOPIC - Abnormal; Notable for the following components:  Color, Urine STRAW (*)    APPearance CLEAR (*)    Glucose, UA 150 (*)    All other components within normal limits  CBG MONITORING, ED - Abnormal; Notable for the following components:   Glucose-Capillary 247 (*)    All other components within normal limits  CBC  LEVETIRACETAM  LEVEL     EKG  On my independent interpretation of this EKG appears to be a sinus rhythm with rate of about 125, axis of -20, intervals appear to be within normal limits, no obvious ischemia appreciated on this EKG   RADIOLOGY   PROCEDURES:  Critical Care performed: No  Procedures   MEDICATIONS ORDERED IN ED: Medications  sodium chloride  0.9 % bolus 1,000 mL (0 mLs Intravenous Stopped 09/25/24 0027)     IMPRESSION / MDM / ASSESSMENT AND PLAN / ED COURSE  I reviewed the triage vital signs and the nursing notes.                               Patient's presentation is most consistent with acute presentation with potential threat to life or bodily function.  59 year old female with history of  seizures who presents today with concern of a seizure.  Lasted about 5 minutes and now resolved.  She has had reassuring MRI and EEG done outpatient, she has scheduled neurology follow-up on Tuesday.  She is tachycardic here but reassuring neuroexam.  Given the reassuring MRI and EEG, unclear what underlying pathophysiology may be resulting in her seizure episodes.  She denies any complaints of chest pain or shortness of breath, I feel unlikely cardiopulmonary source.  She is tachycardic here we are obtaining labs to assess further for electrolyte abnormality however she has been evaluated for similar multiple times now.  Will attempt fluid resuscitation, may need to discuss with neurology at [facility] given her pending follow-up to discuss recommendations and to determine if change in medication will be warranted at this time.   Clinical Course as of 09/25/24 0519  Sat Sep 25, 2024  0100 Patient urinalysis is reassuring, likely undiagnosed diabetes given her glucose here as well as her glucose in the urine.  Reaching out to neurology at [facility] to discuss medication changes if warranted. [SK]  0124 Added on Keppra  level, unfortunately did not anticipate this coming back today, I tried reaching out to [facility] neurology, they informing that they did not do consultations over the phone.  My primary reason for reaching out was to see if any changes to medications will be warranted prior to her follow-up on Tuesday.  I do not feel much utility in contacting our neurologist here as she is not going to follow-up with them.  She is stable for discharge at this time, will have her discharged home, return precautions provided, patient agreeable with the plan. [SK]    Clinical Course User Index [SK] Fernand Rossie HERO, MD     FINAL CLINICAL IMPRESSION(S) / ED DIAGNOSES   Final diagnoses:  Seizure [facility])     Rx / DC Orders   ED Discharge Orders     None        Note:  This document was prepared using Dragon voice  recognition software and may include unintentional dictation errors.   Fernand Rossie HERO, MD 09/25/24 [phone]  "

## 2024-09-24 NOTE — ED Triage Notes (Signed)
 Pt BIB [facility] from home. Pt was sitting in a chair when she started having a sz that lasted about 5 min and was witnessed by family. Pt was A/O when EMS arrived. Pt denies tongue trauma or incontinence. Pt reports she is on 1 g of Keppra  twice daily and she has not missed any doses.   EMS Vitals  HR 130 190/100 CBG 150

## 2024-09-25 LAB — URINALYSIS, ROUTINE W REFLEX MICROSCOPIC
Bilirubin Urine: NEGATIVE
Glucose, UA: 150 mg/dL — AB
Hgb urine dipstick: NEGATIVE
Ketones, ur: NEGATIVE mg/dL
Leukocytes,Ua: NEGATIVE
Nitrite: NEGATIVE
Protein, ur: NEGATIVE mg/dL
Specific Gravity, Urine: 1.014 (ref 1.005–1.030)
pH: 6 (ref 5.0–8.0)

## 2024-09-25 NOTE — ED Notes (Signed)
 Called [facility] per Dr. Deretha

## 2024-09-25 NOTE — Discharge Instructions (Signed)
 You were seen today due to concern of a seizure.  Please be sure to follow-up with your neurologist on Tuesday as you have planned.  If you have any recurrent episodes, develop fevers, headache, or any other symptoms you find concerning please return to the emergency department immediately for further medical management.

## 2024-09-27 LAB — LEVETIRACETAM LEVEL: Levetiracetam Lvl: 31 ug/mL (ref 10.0–40.0)
